# Patient Record
Sex: Male | Born: 1985 | Hispanic: Yes | Marital: Married | Smoking: Never smoker
Health system: Southern US, Community
[De-identification: ages and names within clinical notes are randomized; demographics above are authoritative.]

## PROBLEM LIST (undated history)

## (undated) DIAGNOSIS — I319 Disease of pericardium, unspecified: Secondary | ICD-10-CM

## (undated) DIAGNOSIS — Z87440 Personal history of urinary (tract) infections: Secondary | ICD-10-CM

## (undated) DIAGNOSIS — Z114 Encounter for screening for human immunodeficiency virus [HIV]: Secondary | ICD-10-CM

## (undated) HISTORY — DX: Personal history of urinary (tract) infections: Z87.440

## (undated) HISTORY — DX: Encounter for screening for human immunodeficiency virus (HIV): Z11.4

## (undated) HISTORY — PX: TYMPANOSTOMY TUBE PLACEMENT: SHX32

---
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
## 2012-09-14 ENCOUNTER — Emergency Department (HOSPITAL_COMMUNITY)
Admission: EM | Admit: 2012-09-14 | Discharge: 2012-09-14 | Disposition: A | Discharge status: Home / Self Care | Payer: Self-pay | Attending: Emergency Medicine | Admitting: Emergency Medicine

## 2012-09-14 ENCOUNTER — Emergency Department (HOSPITAL_COMMUNITY): Payer: Self-pay

## 2012-09-14 ENCOUNTER — Encounter (HOSPITAL_COMMUNITY): Payer: Self-pay

## 2012-09-14 DIAGNOSIS — Z880 Allergy status to penicillin: Secondary | ICD-10-CM | POA: Insufficient documentation

## 2012-09-14 DIAGNOSIS — Y939 Activity, unspecified: Secondary | ICD-10-CM | POA: Insufficient documentation

## 2012-09-14 DIAGNOSIS — W108XXA Fall (on) (from) other stairs and steps, initial encounter: Secondary | ICD-10-CM | POA: Insufficient documentation

## 2012-09-14 DIAGNOSIS — S93409A Sprain of unspecified ligament of unspecified ankle, initial encounter: Secondary | ICD-10-CM | POA: Insufficient documentation

## 2012-09-14 DIAGNOSIS — Y929 Unspecified place or not applicable: Secondary | ICD-10-CM | POA: Insufficient documentation

## 2012-09-14 DIAGNOSIS — S93402A Sprain of unspecified ligament of left ankle, initial encounter: Secondary | ICD-10-CM

## 2012-09-14 MED ORDER — HYDROCODONE-ACETAMINOPHEN 5-325 MG PO TABS: 1.0000 | ORAL_TABLET | Freq: Four times a day (QID) | ORAL | Status: DC | PRN

## 2012-09-14 NOTE — ED Notes (Signed)
Pt reports Left ankle pain and swelling due to falling down 5-6 steps last night.

## 2012-09-14 NOTE — ED Provider Notes (Signed)
CSN: [id number]     Arrival date & time 09/14/12  2000 History  This chart was scribed for non-physician practitioner, Jimmye Norman, NP, working with Enid Skeens, MD by Shari Heritage, ED Scribe. This patient was seen in room TR09C/TR09C and the patient's care was started at 2057.   First MD Initiated Contact with Patient 09/14/12 2057     Chief Complaint  Patient presents with  . Ankle Injury    Patient is a 27 y.o. male presenting with ankle pain. The history is provided by the patient. No language interpreter was used.  Ankle Pain Location:  Ankle Time since incident:  1 day Injury: yes   Mechanism of injury: fall   Mechanism of injury comment:  Inversion Fall:    Fall occurred:  Down stairs   Entrapped after fall: no   Ankle location:  L ankle Pain details:    Quality:  Burning   Radiates to:  Does not radiate   Severity:  Moderate   Duration:  1 day   Timing:  Constant Chronicity:  New Dislocation: no   Foreign body present:  No foreign bodies Relieved by:  Elevation and ice Associated symptoms: swelling and tingling (toes)   Associated symptoms: no back pain     HPI Comments: Ryan Harper is a 27 y.o. male who presents to the Emergency Department complaining of moderate, constant, non-radiating left ankle pain with associated swelling onset last night. Patient states that his foot inverted, he lost balance and fell down 5-6 steps. Patient has applied cold water and ice to the area. He has also elevated his left limb providing him some relief from pain. He states that when the ankle is not elevated, he has a burning sensation in his ankle. He also reports some tingling in his left toes. Patient denies any other injuries or symptoms at this time. He reports no pertinent past medical history. He does not smoke.  History reviewed. No pertinent past medical history. Past Surgical History  Procedure Laterality Date  . Tympanostomy tube placement     History  reviewed. No pertinent family history. History  Substance Use Topics  . Smoking status: Never Smoker   . Smokeless tobacco: Not on file  . Alcohol Use: Yes     Comment: social    Review of Systems  Musculoskeletal: Negative for back pain.  Neurological: Negative for weakness.  All other systems reviewed and are negative.    Allergies  Penicillins  Home Medications  No current outpatient prescriptions on file.  Triage Vitals: BP 153/83  Pulse 88  Temp(Src) 98.2 F (36.8 C) (Oral)  Resp 20  SpO2 95%   Physical Exam  Nursing note and vitals reviewed. Constitutional: He is oriented to person, place, and time. He appears well-developed and well-nourished. No distress.  HENT:  Head: Normocephalic and atraumatic.  Eyes: EOM are normal.  Neck: Neck supple. No tracheal deviation present.  Cardiovascular: Normal rate.   Pulmonary/Chest: Effort normal. No respiratory distress.  Musculoskeletal: Normal range of motion.       Right ankle: He exhibits swelling.       Right foot: He exhibits swelling.  Swelling to both sides of left ankle with discoloration. Swelling extends from the ankle to the top of the foot. Distal PMS is intact.  Neurological: He is alert and oriented to person, place, and time.  Skin: Skin is warm and dry.  Psychiatric: He has a normal mood and affect. His behavior is normal.  ED Course   Procedures (including critical care time) DIAGNOSTIC STUDIES: Oxygen Saturation is 95% on room air, adequate by my interpretation.    COORDINATION OF CARE: 9:09 PM- Patient informed of current plan for treatment and evaluation and agrees with plan at this time.    Dg Ankle Complete Left  09/14/2012   *RADIOLOGY REPORT*  Clinical Data: Twisting injury.  Ankle pain and swelling.  LEFT ANKLE COMPLETE - 3+ VIEW  Comparison: None.  Findings: The mineralization and alignment are normal.  There is no evidence of acute fracture or dislocation.  The joint spaces are  maintained.  There is diffuse soft tissue swelling surrounding the ankle with evidence of a small joint effusion.  IMPRESSION: No acute osseous findings.   Original Report Authenticated By: Carey Bullocks, M.D.     1. Severe ankle sprain, left, initial encounter     MDM   No fracture noted on xray.  Splint requested. Patient already has access to crutches.  Ortho follow-up.      I personally performed the services described in this documentation, which was scribed in my presence. The recorded information has been reviewed and is accurate.    Jimmye Norman, NP 09/14/12 [phone]

## 2012-09-16 NOTE — ED Provider Notes (Signed)
Medical screening examination/treatment/procedure(s) were performed by non-physician practitioner and as supervising physician I was immediately available for consultation/collaboration.   Enid Skeens, MD 09/16/12 0100

## 2017-12-18 ENCOUNTER — Ambulatory Visit: Payer: Self-pay | Admitting: Family Medicine

## 2017-12-18 ENCOUNTER — Encounter: Payer: Self-pay | Admitting: Family Medicine

## 2017-12-18 ENCOUNTER — Ambulatory Visit (INDEPENDENT_AMBULATORY_CARE_PROVIDER_SITE_OTHER): Payer: Managed Care, Other (non HMO) | Admitting: Family Medicine

## 2017-12-18 VITALS — BP 110/82 | HR 83 | Temp 97.8°F | Ht 72.0 in | Wt 246.0 lb

## 2017-12-18 DIAGNOSIS — H539 Unspecified visual disturbance: Secondary | ICD-10-CM | POA: Diagnosis not present

## 2017-12-18 DIAGNOSIS — Z1379 Encounter for other screening for genetic and chromosomal anomalies: Secondary | ICD-10-CM

## 2017-12-18 DIAGNOSIS — Z0000 Encounter for general adult medical examination without abnormal findings: Secondary | ICD-10-CM

## 2017-12-18 DIAGNOSIS — Z23 Encounter for immunization: Secondary | ICD-10-CM

## 2017-12-18 LAB — COMPREHENSIVE METABOLIC PANEL
ALBUMIN: 4.7 g/dL (ref 3.5–5.2)
ALT: 25 U/L (ref 0–53)
AST: 17 U/L (ref 0–37)
Alkaline Phosphatase: 86 U/L (ref 39–117)
BUN: 15 mg/dL (ref 6–23)
CHLORIDE: 103 meq/L (ref 96–112)
CO2: 30 meq/L (ref 19–32)
CREATININE: 0.88 mg/dL (ref 0.40–1.50)
Calcium: 9.7 mg/dL (ref 8.4–10.5)
GFR: 106.52 mL/min (ref >60.00)
GLUCOSE: 94 mg/dL (ref 70–99)
Potassium: 4 mEq/L (ref 3.5–5.1)
Sodium: 140 mEq/L (ref 135–145)
TOTAL PROTEIN: 7.3 g/dL (ref 6.0–8.3)
Total Bilirubin: 0.6 mg/dL (ref 0.2–1.2)

## 2017-12-18 LAB — CBC WITH DIFFERENTIAL/PLATELET
BASOS PCT: 0.6 % (ref 0.0–3.0)
Basophils Absolute: 0 10*3/uL (ref 0.0–0.1)
EOS ABS: 0.1 10*3/uL (ref 0.0–0.7)
Eosinophils Relative: 2 % (ref 0.0–5.0)
HCT: 45.9 % (ref 39.0–52.0)
Hemoglobin: 15.7 g/dL (ref 13.0–17.0)
Lymphocytes Relative: 50.9 % — ABNORMAL HIGH (ref 12.0–46.0)
Lymphs Abs: 3.3 10*3/uL (ref 0.7–4.0)
MCHC: 34.2 g/dL (ref 30.0–36.0)
MCV: 90.5 fl (ref 78.0–100.0)
MONO ABS: 0.5 10*3/uL (ref 0.1–1.0)
Monocytes Relative: 8.1 % (ref 3.0–12.0)
NEUTROS ABS: 2.5 10*3/uL (ref 1.4–7.7)
Neutrophils Relative %: 38.4 % — ABNORMAL LOW (ref 43.0–77.0)
Platelets: 313 10*3/uL (ref 150.0–400.0)
RBC: 5.07 Mil/uL (ref 4.22–5.81)
RDW: 13.3 % (ref 11.5–15.5)
WBC: 6.5 10*3/uL (ref 4.0–10.5)

## 2017-12-18 LAB — LIPID PANEL
CHOLESTEROL: 233 mg/dL — AB (ref 0–200)
HDL: 48.6 mg/dL (ref >39.00)
LDL Cholesterol: 147 mg/dL — ABNORMAL HIGH (ref 0–99)
NonHDL: 184.75
Total CHOL/HDL Ratio: 5
Triglycerides: 191 mg/dL — ABNORMAL HIGH (ref 0.0–149.0)
VLDL: 38.2 mg/dL (ref 0.0–40.0)

## 2017-12-18 LAB — TSH: TSH: 2.42 u[IU]/mL (ref 0.35–4.50)

## 2017-12-18 NOTE — Progress Notes (Signed)
Patient: Ryan Harper MRN: [id number] DOB: 01/19/1986 PCP: Orland Mustard, MD     Subjective:  Chief Complaint  Patient presents with  . Establish Care    HPI: The patient is a 32 y.o. male who presents today for annual exam. He denies any changes to past medical history. There have been no recent hospitalizations. They are following a well balanced diet and exercise plan. He does not do any exercise outside of work. Works at [employer].  Weight has been stable. No complaints today.   He is requesting a hgb electrophoresis to be done per OB due his wife's "condition."   There is no immunization history for the selected administration types on file for this patient.  Tdap: 2010 Flu: declines  Gc/c: declines hiv testing: done   Review of Systems  Constitutional: Negative for chills, fatigue and fever.  HENT: Negative for dental problem, ear pain, hearing loss and trouble swallowing.   Eyes: Negative for visual disturbance.  Respiratory: Negative for cough, chest tightness and shortness of breath.   Cardiovascular: Negative for chest pain, palpitations and leg swelling.  Gastrointestinal: Negative for abdominal pain, blood in stool, diarrhea and nausea.  Endocrine: Negative for cold intolerance, polydipsia, polyphagia and polyuria.  Genitourinary: Negative for dysuria and hematuria.  Musculoskeletal: Negative for arthralgias.  Skin: Negative for rash.  Neurological: Negative for dizziness and headaches.  Psychiatric/Behavioral: Negative for dysphoric mood and sleep disturbance. The patient is not nervous/anxious.     Allergies Patient is allergic to penicillins.  Past Medical History Patient  has no past medical history on file.  Surgical History Patient  has a past surgical history that includes Tympanostomy tube placement and Tympanostomy tube placement (1992).  Family History Pateint's family history is not on file.  Social History Patient   reports that he has never smoked. He has never used smokeless tobacco. He reports that he drinks alcohol. He reports that he does not use drugs.    Objective: Vitals:   12/18/17 0856  BP: 110/82  Pulse: 83  Temp: 97.8 F (36.6 C)  TempSrc: Oral  SpO2: 97%  Weight: 246 lb (111.6 kg)  Height: 6' (1.829 m)    Body mass index is 33.36 kg/m.  Physical Exam  Constitutional: He is oriented to person, place, and time. He appears well-developed and well-nourished.  HENT:  Right Ear: External ear normal.  Left Ear: External ear normal.  Mouth/Throat: Oropharynx is clear and moist.  Eyes: Pupils are equal, round, and reactive to light. Conjunctivae and EOM are normal.  Neck: Normal range of motion. Neck supple. No thyromegaly present.  Cardiovascular: Normal rate, regular rhythm, normal heart sounds and intact distal pulses.  No murmur heard. Pulmonary/Chest: Effort normal and breath sounds normal.  Abdominal: Soft. Bowel sounds are normal. He exhibits no distension. There is no tenderness.  Lymphadenopathy:    He has no cervical adenopathy.  Neurological: He is alert and oriented to person, place, and time. He displays normal reflexes. No cranial nerve deficit. Coordination normal.  Skin: Skin is warm and dry. No rash noted.  Psychiatric: He has a normal mood and affect. His behavior is normal.  Vitals reviewed.  Depression screen PHQ 2/9 12/18/2017  Decreased Interest 0  Down, Depressed, Hopeless 0  PHQ - 2 Score 0       Assessment/plan: 1. Annual physical exam Giving tdap since has newborn on way in 3 months. Declines flu, gc/c testing/has had HIV done. Really encouraged him to start a regular  exercise program to help with weight and overall cardiac health. F/u in one year.  Patient counseling [x]    Nutrition: Stressed importance of moderation in sodium/caffeine intake, saturated fat and cholesterol, caloric balance, sufficient intake of fresh fruits, vegetables, fiber,  calcium, iron, and 1 mg of folate supplement per day (for females capable of pregnancy).  [x]    Stressed the importance of regular exercise.   [x]    Substance Abuse: Discussed cessation/primary prevention of tobacco, alcohol, or other drug use; driving or other dangerous activities under the influence; availability of treatment for abuse.   [x]    Injury prevention: Discussed safety belts, safety helmets, smoke detector, smoking near bedding or upholstery.   [x]    Sexuality: Discussed sexually transmitted diseases, partner selection, use of condoms, avoidance of unintended pregnancy  and contraceptive alternatives.  [x]    Dental health: Discussed importance of regular tooth brushing, flossing, and dental visits.  [x]    Health maintenance and immunizations reviewed. Please refer to Health maintenance section.   - Comprehensive metabolic panel - CBC with Differential/Platelet - TSH - Lipid panel  2. Genetic screening Will check this, discussed usually referring OB would check this.  - Hemoglobinopathy evaluation  3. Need for Tdap vaccination Newborn coming.. Needs this.  - Tdap vaccine greater than or equal to 7yo IM  4. Vision changes Interested in lasix, but needs eye exam with his "eye concerns." referral done.  - Ambulatory referral to Ophthalmology     Return in about 1 year (around 12/19/2018).     Orland Mustard, MD [facility]  12/18/2017

## 2017-12-21 LAB — HEMOGLOBINOPATHY EVALUATION
HGB C: 0 %
HGB S: 0 %
HGB VARIANT: 0 %
Hemoglobin A2 Quantitation: 2.4 % (ref 1.8–3.2)
Hemoglobin F Quantitation: 0 % (ref 0.0–2.0)
Hgb A: 97.6 % (ref 96.4–98.8)

## 2017-12-28 ENCOUNTER — Telehealth: Payer: Self-pay | Admitting: Family Medicine

## 2017-12-28 NOTE — Telephone Encounter (Signed)
Charted in result notes. 

## 2017-12-28 NOTE — Telephone Encounter (Signed)
Copied from CRM [id number]. Topic: Quick Communication - Lab Results (Clinic Use ONLY) >> Dec 22, 2017  4:28 PM Zellmer, Alessandra BevelsJennifer M, CMA wrote: Called patient to inform them of 11/11 lab results. When patient returns call, triage nurse may disclose results.

## 2018-01-05 ENCOUNTER — Telehealth: Payer: Self-pay

## 2018-01-05 NOTE — Telephone Encounter (Signed)
Reviewed lab results and instructions with pt. Verbalizes understanding.

## 2018-01-05 NOTE — Telephone Encounter (Signed)
Pt would like a call back to go over results 

## 2018-02-26 ENCOUNTER — Encounter: Payer: Self-pay | Admitting: Family Medicine

## 2018-02-26 ENCOUNTER — Ambulatory Visit (INDEPENDENT_AMBULATORY_CARE_PROVIDER_SITE_OTHER): Payer: Managed Care, Other (non HMO) | Admitting: Family Medicine

## 2018-02-26 ENCOUNTER — Ambulatory Visit (INDEPENDENT_AMBULATORY_CARE_PROVIDER_SITE_OTHER): Payer: Managed Care, Other (non HMO)

## 2018-02-26 VITALS — BP 110/72 | HR 135 | Temp 101.9°F | Ht 72.0 in | Wt 243.8 lb

## 2018-02-26 DIAGNOSIS — R0602 Shortness of breath: Secondary | ICD-10-CM | POA: Diagnosis not present

## 2018-02-26 DIAGNOSIS — R6889 Other general symptoms and signs: Secondary | ICD-10-CM

## 2018-02-26 LAB — POCT INFLUENZA A/B
Influenza A, POC: NEGATIVE
Influenza B, POC: NEGATIVE

## 2018-02-26 NOTE — Progress Notes (Signed)
Patient: Ryan Harper MRN: [id number] DOB: [birth year] PCP: Orland Mustard, MD     Subjective:  Chief Complaint  Patient presents with  . Influenza  . Shortness of Breath    HPI: The patient is a 33 y.o. male who presents today for flu like illness. Symptoms started yesterday when he woke up around 1pm. He works 3rd shift. He had a high fever yesterday, cough, congestion, achy all over. He has one sick contact at work. Unsure what he had. He has taken some over the counter medication, he thinks is mucinex. Nothing for his fever. He is drinking water. He has some shortness of breath, but no wheezing. He has had a few episodes where he felt short of breath and had a tight chest. He thought he was going to pass out. He felt like his heart was racing too, like he ran a race. He did have a high fever at that time. His cough is productive and he has a struggle he states getting air all the way in. He is feeling better today than yesterday.   Review of Systems  Constitutional: Positive for chills, fatigue and fever.  HENT: Positive for congestion, postnasal drip and sore throat.   Respiratory: Positive for cough, chest tightness and shortness of breath. Negative for wheezing.   Gastrointestinal: Positive for nausea. Negative for abdominal pain, diarrhea and vomiting.  Musculoskeletal: Positive for arthralgias and myalgias.  Skin: Negative for rash.  Neurological: Positive for headaches.    Allergies Patient is allergic to penicillins.  Past Medical History Patient  has a past medical history of History of recurrent UTIs and Screening for HIV (human immunodeficiency virus).  Surgical History Patient  has a past surgical history that includes Tympanostomy tube placement and Tympanostomy tube placement (1992).  Family History Pateint's Family history is unknown by patient.  Social History Patient  reports that he has never smoked. He has never used smokeless tobacco. He reports  current alcohol use. He reports that he does not use drugs.    Objective: Vitals:   02/26/18 1429  BP: 110/72  Pulse: (!) 135  Temp: (!) 101.9 F (38.8 C)  TempSrc: Oral  SpO2: 98%  Weight: 243 lb 12 oz (110.6 kg)  Height: 6' (1.829 m)    Body mass index is 33.06 kg/m.  Physical Exam Vitals signs reviewed.  Constitutional:      General: He is not in acute distress.    Appearance: He is obese. He is not toxic-appearing.  HENT:     Head:     Comments: Tm pearly with light reflex bilaterally  +cobblestoning on posterior pharynx TTP over sinuses Neck:     Musculoskeletal: Normal range of motion and neck supple.  Cardiovascular:     Rate and Rhythm: Regular rhythm. Tachycardia present.     Heart sounds: No murmur.  Pulmonary:     Effort: Pulmonary effort is normal. No tachypnea.     Breath sounds: Normal breath sounds. No decreased breath sounds, wheezing, rhonchi or rales.  Skin:    Capillary Refill: Capillary refill takes less than 2 seconds.  Neurological:     General: No focal deficit present.     Mental Status: He is alert and oriented to person, place, and time.    Flu: negative CXR: no acute processes.     Assessment/plan: 1. Flu-like symptoms symptoms consistent with flu. Declines tamiflu. Will continue with conservative therapy and discussed he needs to get medication for fever. His heart rate is fast  due to fever and he needs to make sure he is drinking. Discussed tylenol and motrin dosage and wrote down for him. Discussed otc medication he can use as well. His wife is pregnant and recommended they call OB to see if she can get tamiflu. If symptoms worsen, do not get better-ER or return here.  - POCT Influenza A/B  2. shortness of breath -CXR normal. He is better today. likely secondary to flu with no signs of pneumonia. If worsens or has chest pressure recommended he go to ER.    Return if symptoms worsen or fail to improve.     Orland Mustard,  MD [facility]  02/26/2018

## 2018-02-26 NOTE — Patient Instructions (Addendum)
For fever.Ryan KitchenMarland Harper  1) tylenol 1000mg  every 8 hours or 500mg  every 6 hours.   2) ibuprofen 800mg  up to three times a day.    Push fluids...  If you get increased shortness of breath or feel like pressure.Ryan Harper go to ER   Influenza, Adult Influenza is also called "the flu." It is an infection in the lungs, nose, and throat (respiratory tract). It is caused by a virus. The flu causes symptoms that are similar to symptoms of a cold. It also causes a high fever and body aches. The flu spreads easily from person to person (is contagious). Getting a flu shot (influenza vaccination) every year is the best way to prevent the flu. What are the causes? This condition is caused by the influenza virus. You can get the virus by:  Breathing in droplets that are in the air from the cough or sneeze of a person who has the virus.  Touching something that has the virus on it (is contaminated) and then touching your mouth, nose, or eyes. What increases the risk? Certain things may make you more likely to get the flu. These include:  Not washing your hands often.  Having close contact with many people during cold and flu season.  Touching your mouth, eyes, or nose without first washing your hands.  Not getting a flu shot every year. You may have a higher risk for the flu, along with serious problems such as a lung infection (pneumonia), if you:  Are older than 65.  Are pregnant.  Have a weakened disease-fighting system (immune system) because of a disease or taking certain medicines.  Have a long-term (chronic) illness, such as: ? Heart, kidney, or lung disease. ? Diabetes. ? Asthma.  Have a liver disorder.  Are very overweight (morbidly obese).  Have anemia. This is a condition that affects your red blood cells. What are the signs or symptoms? Symptoms usually begin suddenly and last 4-14 days. They may include:  Fever and chills.  Headaches, body aches, or muscle aches.  Sore  throat.  Cough.  Runny or stuffy (congested) nose.  Chest discomfort.  Not wanting to eat as much as normal (poor appetite).  Weakness or feeling tired (fatigue).  Dizziness.  Feeling sick to your stomach (nauseous) or throwing up (vomiting). How is this treated? If the flu is found early, you can be treated with medicine that can help reduce how bad the illness is and how long it lasts (antiviral medicine). This may be given by mouth (orally) or through an IV tube. Taking care of yourself at home can help your symptoms get better. Your doctor may suggest:  Taking over-the-counter medicines.  Drinking plenty of fluids. The flu often goes away on its own. If you have very bad symptoms or other problems, you may be treated in a hospital. Follow these instructions at home:     Activity  Rest as needed. Get plenty of sleep.  Stay home from work or school as told by your doctor. ? Do not leave home until you do not have a fever for 24 hours without taking medicine. ? Leave home only to visit your doctor. Eating and drinking  Take an ORS (oral rehydration solution). This is a drink that is sold at pharmacies and stores.  Drink enough fluid to keep your pee (urine) pale yellow.  Drink clear fluids in small amounts as you are able. Clear fluids include: ? Water. ? Ice chips. ? Fruit juice that has water added (  diluted fruit juice). ? Low-calorie sports drinks.  Eat bland, easy-to-digest foods in small amounts as you are able. These foods include: ? Bananas. ? Applesauce. ? Rice. ? Lean meats. ? Toast. ? Crackers.  Do not eat or drink: ? Fluids that have a lot of sugar or caffeine. ? Alcohol. ? Spicy or fatty foods. General instructions  Take over-the-counter and prescription medicines only as told by your doctor.  Use a cool mist humidifier to add moisture to the air in your home. This can make it easier for you to breathe.  Cover your mouth and nose when you  cough or sneeze.  Wash your hands with soap and water often, especially after you cough or sneeze. If you cannot use soap and water, use alcohol-based hand sanitizer.  Keep all follow-up visits as told by your doctor. This is important. How is this prevented?   Get a flu shot every year. You may get the flu shot in late summer, fall, or winter. Ask your doctor when you should get your flu shot.  Avoid contact with people who are sick during fall and winter (cold and flu season). Contact a doctor if:  You get new symptoms.  You have: ? Chest pain. ? Watery poop (diarrhea). ? A fever.  Your cough gets worse.  You start to have more mucus.  You feel sick to your stomach.  You throw up. Get help right away if you:  Have shortness of breath.  Have trouble breathing.  Have skin or nails that turn a bluish color.  Have very bad pain or stiffness in your neck.  Get a sudden headache.  Get sudden pain in your face or ear.  Cannot eat or drink without throwing up. Summary  Influenza ("the flu") is an infection in the lungs, nose, and throat. It is caused by a virus.  Take over-the-counter and prescription medicines only as told by your doctor.  Getting a flu shot every year is the best way to avoid getting the flu. This information is not intended to replace advice given to you by your health care provider. Make sure you discuss any questions you have with your health care provider. Document Released: 11/03/2007 Document Revised: 07/12/2017 Document Reviewed: 07/12/2017 Elsevier Interactive Patient Education  2019 [employer]

## 2018-02-28 ENCOUNTER — Telehealth: Payer: Self-pay | Admitting: Family Medicine

## 2018-02-28 NOTE — Telephone Encounter (Signed)
Pt given x-ray results per notes of Dr. Artis Flock on 02/26/18. Pt verbalized understanding. He says the chest pain that he was having is still continuing. He says Sunday was bad and he was lightheaded. He says it's coming on off and on. He asks for an appointment with Dr. Artis Flock, appointment scheduled for tomorrow, 03/01/18 at 1300. Advised to go to ED if chest pain persists as noted by last OV note, he verbalized understanding.

## 2018-03-01 ENCOUNTER — Encounter: Payer: Self-pay | Admitting: Family Medicine

## 2018-03-01 ENCOUNTER — Ambulatory Visit (INDEPENDENT_AMBULATORY_CARE_PROVIDER_SITE_OTHER): Payer: Managed Care, Other (non HMO) | Admitting: Family Medicine

## 2018-03-01 VITALS — BP 102/74 | HR 100 | Temp 98.1°F | Ht 72.0 in | Wt 243.6 lb

## 2018-03-01 DIAGNOSIS — R002 Palpitations: Secondary | ICD-10-CM | POA: Diagnosis not present

## 2018-03-01 DIAGNOSIS — I4892 Unspecified atrial flutter: Secondary | ICD-10-CM

## 2018-03-01 DIAGNOSIS — R0602 Shortness of breath: Secondary | ICD-10-CM

## 2018-03-01 DIAGNOSIS — I300 Acute nonspecific idiopathic pericarditis: Secondary | ICD-10-CM | POA: Diagnosis not present

## 2018-03-01 MED ORDER — COLCHICINE 0.6 MG PO TABS: 0.6000 mg | ORAL_TABLET | Freq: Two times a day (BID) | ORAL | 0 refills | Status: DC

## 2018-03-01 MED ORDER — PANTOPRAZOLE SODIUM 40 MG PO TBEC: 40.0000 mg | DELAYED_RELEASE_TABLET | Freq: Every day | ORAL | 0 refills | Status: DC

## 2018-03-01 MED ORDER — IBUPROFEN 800 MG PO TABS: ORAL_TABLET | ORAL | 0 refills | Status: DC

## 2018-03-01 NOTE — Progress Notes (Signed)
Patient: Ryan Harper MRN: [id number] DOB: July 25, 1985 PCP: Orland Mustard, MD     Subjective:  Chief Complaint  Patient presents with  . Shortness of Breath    HPI: The patient is a 33 y.o. male who presents today for shortness of breath and palpitations with exertion. I saw him on 1/20 for the flu and he had a negative CXR at that time. He states symptoms are only with exertion and he gets chest tightness/pain and shortness of breath. Symptoms usually resolve quickly and are relieved with squatting down or geting low. He only has if he strains, lifts something heavy. He would also feel his pulse throbbing in his head and a strange sensation on side/back of left side of his head that goes away after he stops exerting himself and shortness of breath resolves. Denies any vision changes, slurred speech, weakness, facial drooping, confusion during head sensation. He states the longest episode was ten minutes while raking leaves. He has had about 4-5 episodes in the past two days. He is feeling better as far as his other viral symptoms. He has not had any fevers since I saw him last time. He is no longer achy, just feels weak.   Denies travel, prolonged sitting, never smoked, no trauma. NO history of DVT or family history of clotting.   Review of Systems  Constitutional: Positive for fatigue. Negative for chills and fever.  HENT: Negative for congestion.   Respiratory: Positive for cough, chest tightness and shortness of breath. Negative for wheezing.   Cardiovascular: Positive for palpitations. Negative for chest pain and leg swelling.  Gastrointestinal: Negative for abdominal pain and nausea.  Musculoskeletal: Negative for arthralgias and myalgias.  Skin: Negative for rash.  Neurological: Positive for light-headedness and headaches. Negative for syncope, facial asymmetry, speech difficulty, weakness and numbness.  Psychiatric/Behavioral: Negative for confusion.   Risks for PE: no  smoking, no travel in car/plane, no trauma. Recent illness.   Allergies Patient is allergic to penicillins.  Past Medical History Patient  has a past medical history of History of recurrent UTIs and Screening for HIV (human immunodeficiency virus).  Surgical History Patient  has a past surgical history that includes Tympanostomy tube placement and Tympanostomy tube placement (1992).  Family History Pateint's Family history is unknown by patient.  Social History Patient  reports that he has never smoked. He has never used smokeless tobacco. He reports current alcohol use. He reports that he does not use drugs.    Objective: Vitals:   03/01/18 1425  BP: 102/74  Pulse: 100  Temp: 98.1 F (36.7 C)  TempSrc: Oral  SpO2: 99%  Weight: 243 lb 9.6 oz (110.5 kg)  Height: 6' (1.829 m)    Body mass index is 33.04 kg/m.  Physical Exam Vitals signs reviewed.  Constitutional:      General: He is not in acute distress.    Appearance: He is obese. He is not ill-appearing.  HENT:     Head: Normocephalic and atraumatic.  Neck:     Musculoskeletal: Normal range of motion and neck supple.     Vascular: No hepatojugular reflux.  Cardiovascular:     Rate and Rhythm: Normal rate and regular rhythm.     Heart sounds: No murmur. No friction rub.  Lymphadenopathy:     Cervical: No cervical adenopathy.  Neurological:     Mental Status: He is alert.       Ekg: atrial flutter, rate of 83  Wells score: zero   Assessment/plan: 1.  Atrial flutter, unspecified type (HCC) Possibly secondary to pericarditis. clinically stable at this time. Checking routine labs for palpitations. PE very unlikely as no risk factors and wells score of zero. CXR with no acute consolidation/infiltrate. Getting stat echo on him that is scheduled for tomorrow AM, starting treatment for pericarditis with NSAIDS/colchicine, no exercise and has f/u with cardiology. Extremely strict ER precautions given. If fever, any  worsening sob or change in symptoms I want him to go to ER immediately.  - EKG 12-Lead - EKG 12-Lead - ECHOCARDIOGRAM COMPLETE; Future  2. Shortness of breath Secondary to 1/3.  - ECHOCARDIOGRAM COMPLETE; Future  3. Acute idiopathic pericarditis Checking labs, starting treatment for acute pericarditis with NSAIDS/Colchicine and prophylactic protonix. No exercise/heavy exertion until sees cardiology. Echo for tomorrow AM. Strict ER precautions given per above. Wrote everything down for him as well.  - Ambulatory referral to Cardiology - CBC with Differential/Platelet - Comprehensive metabolic panel - C-reactive protein   Return if symptoms worsen or fail to improve.     Orland MustardAllison Natalea Sutliff, MD [facility]  03/01/2018

## 2018-03-01 NOTE — Patient Instructions (Addendum)
-you will start ibuprofen 800mg  three times a day for 10 days -colchicine twice a day for THREE MONTHS -protonix is a pill that you will take every AM until done with the colchicine. It just protects your stomach from the above drugs. Common to see ulcers.   Will get echo of your heart and refer to cardiology.  If you experience fever, shortness of breath, fast heart rate or just don't feel good I want you to go to ER.   No exercise until see cardiology   Pericarditis  Pericarditis is inflammation of the pericardium. The pericardium is a thin, double-layered, fluid-filled sac that surrounds your heart. The pericardium protects and holds your heart in your chest cavity. Inflammation of your pericardium can cause rubbing (friction) between the two layers when your heart beats. Fluid may also build up between the layers of the sac (pericardial effusion). There are three types of this condition:  Acute pericarditis. Inflammation develops suddenly and causes pericardial effusion.  Chronic pericarditis. Inflammation may develop slowly, or it may continue after acute pericarditis and last longer than 6 months.  Constrictive pericarditis (rare). The layers of the pericardium stiffen and develop scar tissue. The scar tissue thickens and sticks together. This makes it difficult for the heart to work in a normal way. In most cases, pericarditis is acute and not serious. Chronic pericarditis and constrictive pericarditis are more serious and may require treatment. What are the causes? In most cases, the cause of this condition is not known. If a cause is found, it may be:  An infection from a virus.  A heart attack (myocardial infarction).  Problems after open-heart surgery.  Chest injury.  Autoimmune disorder. The body's defense system (immune system) attacks healthy tissues. Examples include lupus or rheumatoid arthritis.  Kidney failure.  Underactive thyroid gland  (hypothyroidism).  Cancer that has spread (metastasized) to the pericardium from another part of the body.  Treatment using high-energy X-rays (radiation).  Certain medicines, including some seizure medicines, blood thinners, heart medicines, and antibiotics.  Infection from a fungus or bacteria (rare). What increases the risk? The following factors may make you more likely to develop this condition:  Being male.  Being 58-46 years old.  Having had pericarditis before.  Having had a recent upper respiratory tract infection. What are the signs or symptoms? The main symptom of this condition is chest pain. The chest pain may:  Be in the center or the left side of your chest.  Not go away with rest.  Last for many hours or days.  Worsen when you lie down and get better when you sit up and lean forward.  Worsen when you swallow.  Move to your back, neck, or shoulder. Other symptoms may include:  A chronic, dry cough.  Heart palpitations. These may feel like rapid, fluttering, or pounding heartbeats.  Dizziness or fainting.  Tiredness or fatigue.  Fever.  Rapid breathing.  Shortness of breath when lying down. How is this diagnosed? This condition is diagnosed based on medical history, physical exam, and diagnostic tests. During your physical exam, your health care provider will listen for friction while your heart beats (pericardial rub). You may also have tests, including:  Blood tests to look for signs of infection and inflammation.  Electrocardiogram (ECG). This test measures the electrical activity in your heart.  Echocardiogram. This uses sound waves to make images of your heart.  CT scan.  MRI.  Culture of pericardial fluid.  Removing a tissue sample of the  pericardium to look at under a microscope (biopsy). If tests show that you may have constrictive pericarditis, a small, thin tube may be inserted into your heart to confirm the diagnosis (cardiac  catheterization). How is this treated? Treatment for this condition depends on the cause and type of pericarditis that you have. In most cases, acute pericarditis will clear up on its own. Treatment may include:  Limiting physical activity.  Medicines, such as: ? NSAIDs to relieve pain and inflammation. ? Steroids to reduce inflammation. ? Colchicine to relieve pain and inflammation.  A procedure to remove fluid using a needle (pericardiocentesis) if fluid buildup puts pressure on the heart.  Surgery to remove part of the pericardium if constrictive pericarditis develops. If another condition is causing your pericarditis, you may also need treatment for that condition. Follow these instructions at home:  Limit your activity as told by your health care provider until your symptoms improve. This usually includes: ? Resting or sitting for most of the day. ? No long walks. ? No exercise. ? No sports.  Athletes may need to limit competition for several months.  Do not use any products that contain nicotine or tobacco, such as cigarettes and e-cigarettes. If you need help quitting, ask your health care provider.  Eat a heart-healthy diet. Ask your dietitian what foods are healthy for your heart.  Take over-the-counter and prescription medicines only as told by your health care provider. ? If you were prescribed an antibiotic medicine, take it as told by your health care provider. Do not stop taking the antibiotic even if you start to feel better.  Keep all follow-up visits as told by your health care provider. This is important. Contact a health care provider if:  You continue to have symptoms of pericarditis.  You develop new symptoms of pericarditis.  Your symptoms get worse.  You have a fever. Get help right away if:  You have worsening chest pain.  You have difficulty breathing.  You pass out. These symptoms may represent a serious problem that is an emergency. Do not  wait to see if the symptoms will go away. Get medical help right away. Call your local emergency services (911 in the [place] Do not drive yourself to the hospital. Summary  Pericarditis is inflammation of the pericardium.  The pericardium is a thin, double-layered, fluid-filled sac that surrounds your heart.  The main symptom of this condition is chest pain.  Treatment for this condition depends on the cause and type of pericarditis that you have. This information is not intended to replace advice given to you by your health care provider. Make sure you discuss any questions you have with your health care provider. Document Released: 07/20/2000 Document Revised: 03/02/2017 Document Reviewed: 03/02/2017 Elsevier Interactive Patient Education  2019 [employer].

## 2018-03-02 ENCOUNTER — Other Ambulatory Visit (INDEPENDENT_AMBULATORY_CARE_PROVIDER_SITE_OTHER): Payer: Managed Care, Other (non HMO)

## 2018-03-02 ENCOUNTER — Telehealth: Payer: Self-pay

## 2018-03-02 ENCOUNTER — Ambulatory Visit (HOSPITAL_COMMUNITY): Payer: Managed Care, Other (non HMO) | Attending: Cardiovascular Disease

## 2018-03-02 ENCOUNTER — Other Ambulatory Visit: Payer: Self-pay | Admitting: Family Medicine

## 2018-03-02 ENCOUNTER — Other Ambulatory Visit: Payer: Self-pay

## 2018-03-02 DIAGNOSIS — I4892 Unspecified atrial flutter: Secondary | ICD-10-CM

## 2018-03-02 DIAGNOSIS — R0602 Shortness of breath: Secondary | ICD-10-CM | POA: Insufficient documentation

## 2018-03-02 LAB — CBC WITH DIFFERENTIAL/PLATELET
BASOS PCT: 0.9 % (ref 0.0–3.0)
Basophils Absolute: 0 10*3/uL (ref 0.0–0.1)
EOS ABS: 0 10*3/uL (ref 0.0–0.7)
Eosinophils Relative: 0.5 % (ref 0.0–5.0)
HEMATOCRIT: 47.9 % (ref 39.0–52.0)
HEMOGLOBIN: 16.3 g/dL (ref 13.0–17.0)
LYMPHS PCT: 50.7 % — AB (ref 12.0–46.0)
Lymphs Abs: 2.4 10*3/uL (ref 0.7–4.0)
MCHC: 34.1 g/dL (ref 30.0–36.0)
MCV: 91.4 fl (ref 78.0–100.0)
Monocytes Absolute: 0.6 10*3/uL (ref 0.1–1.0)
Monocytes Relative: 12.6 % — ABNORMAL HIGH (ref 3.0–12.0)
Neutro Abs: 1.7 10*3/uL (ref 1.4–7.7)
Neutrophils Relative %: 35.3 % — ABNORMAL LOW (ref 43.0–77.0)
Platelets: 230 10*3/uL (ref 150.0–400.0)
RBC: 5.24 Mil/uL (ref 4.22–5.81)
RDW: 13.3 % (ref 11.5–15.5)
WBC: 4.7 10*3/uL (ref 4.0–10.5)

## 2018-03-02 LAB — COMPREHENSIVE METABOLIC PANEL
ALBUMIN: 4.5 g/dL (ref 3.5–5.2)
ALK PHOS: 67 U/L (ref 39–117)
ALT: 22 U/L (ref 0–53)
AST: 17 U/L (ref 0–37)
BILIRUBIN TOTAL: 0.4 mg/dL (ref 0.2–1.2)
BUN: 8 mg/dL (ref 6–23)
CALCIUM: 9.3 mg/dL (ref 8.4–10.5)
CO2: 32 mEq/L (ref 19–32)
Chloride: 101 mEq/L (ref 96–112)
Creatinine, Ser: 0.88 mg/dL (ref 0.40–1.50)
GFR: 100.09 mL/min (ref >60.00)
GLUCOSE: 93 mg/dL (ref 70–99)
Potassium: 4 mEq/L (ref 3.5–5.1)
Sodium: 140 mEq/L (ref 135–145)
TOTAL PROTEIN: 7.5 g/dL (ref 6.0–8.3)

## 2018-03-02 LAB — TSH: TSH: 1.19 u[IU]/mL (ref 0.35–4.50)

## 2018-03-02 LAB — C-REACTIVE PROTEIN: CRP: 0.4 mg/dL — AB (ref 0.5–20.0)

## 2018-03-02 NOTE — Telephone Encounter (Signed)
Patient also informed that his echocardiogram result was normal, showed that heart is pumping normal, atria are slightly dilated and can discuss on 2/18 at his appt w/cardiology.  He verbalized understanding.

## 2018-03-07 ENCOUNTER — Telehealth: Payer: Self-pay

## 2018-03-07 ENCOUNTER — Other Ambulatory Visit: Payer: Self-pay

## 2018-03-07 NOTE — Telephone Encounter (Signed)
Pt stopped in office to pick up new work excuse.  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  New letter drafted and given to pt.

## 2018-03-27 ENCOUNTER — Encounter

## 2018-03-27 ENCOUNTER — Ambulatory Visit: Payer: Managed Care, Other (non HMO) | Admitting: Cardiology

## 2018-04-25 ENCOUNTER — Encounter (HOSPITAL_COMMUNITY): Payer: Self-pay

## 2018-04-25 ENCOUNTER — Emergency Department (HOSPITAL_COMMUNITY): Payer: Managed Care, Other (non HMO)

## 2018-04-25 ENCOUNTER — Observation Stay (HOSPITAL_COMMUNITY)
Admission: EM | Admit: 2018-04-25 | Discharge: 2018-04-26 | Disposition: A | Discharge status: Home / Self Care | Payer: Managed Care, Other (non HMO) | Attending: Cardiovascular Disease | Admitting: Cardiovascular Disease

## 2018-04-25 DIAGNOSIS — I4892 Unspecified atrial flutter: Secondary | ICD-10-CM | POA: Diagnosis not present

## 2018-04-25 DIAGNOSIS — Z79899 Other long term (current) drug therapy: Secondary | ICD-10-CM | POA: Diagnosis not present

## 2018-04-25 DIAGNOSIS — I499 Cardiac arrhythmia, unspecified: Secondary | ICD-10-CM | POA: Diagnosis present

## 2018-04-25 DIAGNOSIS — Z880 Allergy status to penicillin: Secondary | ICD-10-CM | POA: Diagnosis not present

## 2018-04-25 DIAGNOSIS — I4891 Unspecified atrial fibrillation: Secondary | ICD-10-CM | POA: Diagnosis present

## 2018-04-25 DIAGNOSIS — Z791 Long term (current) use of non-steroidal anti-inflammatories (NSAID): Secondary | ICD-10-CM | POA: Insufficient documentation

## 2018-04-25 DIAGNOSIS — R000 Tachycardia, unspecified: Secondary | ICD-10-CM

## 2018-04-25 DIAGNOSIS — I472 Ventricular tachycardia: Secondary | ICD-10-CM

## 2018-04-25 HISTORY — DX: Disease of pericardium, unspecified: I31.9

## 2018-04-25 LAB — POCT I-STAT EG7
Bicarbonate: 27.3 mmol/L (ref 20.0–28.0)
Calcium, Ion: 1.15 mmol/L (ref 1.15–1.40)
HCT: 47 % (ref 39.0–52.0)
Hemoglobin: 16 g/dL (ref 13.0–17.0)
O2 Saturation: 42 %
Potassium: 4 mmol/L (ref 3.5–5.1)
Sodium: 140 mmol/L (ref 135–145)
TCO2: 29 mmol/L (ref 22–32)
pCO2, Ven: 51.7 mmHg (ref 44.0–60.0)
pH, Ven: 7.33 (ref 7.250–7.430)
pO2, Ven: 26 mmHg — CL (ref 32.0–45.0)

## 2018-04-25 LAB — CBC WITH DIFFERENTIAL/PLATELET
ABS IMMATURE GRANULOCYTES: 0.04 10*3/uL (ref 0.00–0.07)
Basophils Absolute: 0.1 10*3/uL (ref 0.0–0.1)
Basophils Relative: 0 %
EOS ABS: 0.1 10*3/uL (ref 0.0–0.5)
Eosinophils Relative: 0 %
HEMATOCRIT: 46.4 % (ref 39.0–52.0)
Hemoglobin: 15.9 g/dL (ref 13.0–17.0)
IMMATURE GRANULOCYTES: 0 %
LYMPHS ABS: 4.8 10*3/uL — AB (ref 0.7–4.0)
Lymphocytes Relative: 43 %
MCH: 30.7 pg (ref 26.0–34.0)
MCHC: 34.3 g/dL (ref 30.0–36.0)
MCV: 89.6 fL (ref 80.0–100.0)
MONO ABS: 0.9 10*3/uL (ref 0.1–1.0)
MONOS PCT: 8 %
NEUTROS ABS: 5.4 10*3/uL (ref 1.7–7.7)
NEUTROS PCT: 49 %
Platelets: 403 10*3/uL — ABNORMAL HIGH (ref 150–400)
RBC: 5.18 MIL/uL (ref 4.22–5.81)
RDW: 12.3 % (ref 11.5–15.5)
WBC: 11.2 10*3/uL — ABNORMAL HIGH (ref 4.0–10.5)
nRBC: 0 % (ref 0.0–0.2)

## 2018-04-25 LAB — COMPREHENSIVE METABOLIC PANEL
ALT: 53 U/L — AB (ref 0–44)
AST: 29 U/L (ref 15–41)
Albumin: 4.8 g/dL (ref 3.5–5.0)
Alkaline Phosphatase: 99 U/L (ref 38–126)
Anion gap: 12 (ref 5–15)
BILIRUBIN TOTAL: 0.6 mg/dL (ref 0.3–1.2)
BUN: 14 mg/dL (ref 6–20)
CALCIUM: 9.4 mg/dL (ref 8.9–10.3)
CO2: 26 mmol/L (ref 22–32)
CREATININE: 1.41 mg/dL — AB (ref 0.61–1.24)
Chloride: 101 mmol/L (ref 98–111)
GFR calc Af Amer: >60 mL/min (ref >60)
Glucose, Bld: 159 mg/dL — ABNORMAL HIGH (ref 70–99)
Potassium: 3.8 mmol/L (ref 3.5–5.1)
Sodium: 139 mmol/L (ref 135–145)
TOTAL PROTEIN: 8.6 g/dL — AB (ref 6.5–8.1)

## 2018-04-25 LAB — I-STAT TROPONIN, ED: TROPONIN I, POC: 0.01 ng/mL (ref 0.00–0.08)

## 2018-04-25 LAB — C-REACTIVE PROTEIN: CRP: 1 mg/dL — AB (ref <1.0)

## 2018-04-25 LAB — I-STAT CREATININE, ED: Creatinine, Ser: 1.5 mg/dL — ABNORMAL HIGH (ref 0.61–1.24)

## 2018-04-25 MED ORDER — AMIODARONE HCL IN DEXTROSE 360-4.14 MG/200ML-% IV SOLN
60.0000 mg/h | INTRAVENOUS | Status: DC
  Filled 2018-04-25: qty 200

## 2018-04-25 MED ORDER — AMIODARONE LOAD VIA INFUSION
150.0000 mg | Freq: Once | INTRAVENOUS | Status: DC
  Filled 2018-04-25: qty 83.34

## 2018-04-25 MED ORDER — AMIODARONE HCL IN DEXTROSE 360-4.14 MG/200ML-% IV SOLN
30.0000 mg/h | INTRAVENOUS | Status: DC
  Administered 2018-04-25: 30 mg/h via INTRAVENOUS
  Filled 2018-04-25: qty 200

## 2018-04-25 MED ORDER — AMIODARONE IV BOLUS ONLY 150 MG/100ML
INTRAVENOUS | Status: AC
  Administered 2018-04-25: 300 mg
  Filled 2018-04-25: qty 200

## 2018-04-25 NOTE — ED Notes (Signed)
ED TO INPATIENT HANDOFF REPORT  ED Nurse Name and Phone #: Maralyn Sago, RN   S Name/Age/Gender [name] 33 y.o. male Room/Bed: RESA/RESA  Code Status   Code Status: Not on file  Home/SNF/Other Home Patient oriented to: self Is this baseline? Yes   Triage Complete: Triage complete  Chief Complaint Tachycardia  Triage Note Pt complains of being short of breath and a fast heartrate, he states that he had the flu and pericarditis one month ago Pt states that he feels very dizzy and nauseated This started about 30 minutes ago   Allergies Allergies  Allergen Reactions  . Penicillins Other (See Comments)    Did it involve swelling of the face/tongue/throat, SOB, or low BP? Unknown Did it involve sudden or severe rash/hives, skin peeling, or any reaction on the inside of your mouth or nose? Unknown Did you need to seek medical attention at a hospital or doctor's office? Unknown When did it last happen?always told not to take If all above answers are "NO", may proceed with cephalosporin use.     Level of Care/Admitting Diagnosis ED Disposition    ED Disposition Condition Comment   Admit  Hospital Area: [facility] [[id number]]  Level of Care: Telemetry Cardiac [103]  Diagnosis: Rapid atrial fibrillation [facility]) [[id number]]  Admitting Physician: Kristine Royal  Attending Physician: Prentice Docker A [[id number]]  Estimated length of stay: past midnight tomorrow  Certification:: I certify this patient will need inpatient services for at least 2 midnights  PT Class (Do Not Modify): Inpatient [101]  PT Acc Code (Do Not Modify): Private [1]       B Medical/Surgery History Past Medical History:  Diagnosis Date  . History of recurrent UTIs   . Pericarditis   . Screening for HIV (human immunodeficiency virus)    Past Surgical History:  Procedure Laterality Date  . TYMPANOSTOMY TUBE PLACEMENT    . TYMPANOSTOMY TUBE PLACEMENT  1992      A IV Location/Drains/Wounds Patient Lines/Drains/Airways Status   Active Line/Drains/Airways    Name:   Placement date:   Placement time:   Site:   Days:   Peripheral IV 04/25/18 Left Antecubital   04/25/18    2238    Antecubital   less than 1          Intake/Output Last 24 hours No intake or output data in the 24 hours ending 04/25/18 2353  Labs/Imaging Results for orders placed or performed during the hospital encounter of 04/25/18 (from the past 48 hour(s))  CBC with Differential/Platelet     Status: Abnormal   Collection Time: 04/25/18 10:43 PM  Result Value Ref Range   WBC 11.2 (H) 4.0 - 10.5 K/uL   RBC 5.18 4.22 - 5.81 MIL/uL   Hemoglobin 15.9 13.0 - 17.0 g/dL   HCT 72.5 36.6 - 44.0 %   MCV 89.6 80.0 - 100.0 fL   MCH 30.7 26.0 - 34.0 pg   MCHC 34.3 30.0 - 36.0 g/dL   RDW 34.7 42.5 - 95.6 %   Platelets 403 (H) 150 - 400 K/uL   nRBC 0.0 0.0 - 0.2 %   Neutrophils Relative % 49 %   Neutro Abs 5.4 1.7 - 7.7 K/uL   Lymphocytes Relative 43 %   Lymphs Abs 4.8 (H) 0.7 - 4.0 K/uL   Monocytes Relative 8 %   Monocytes Absolute 0.9 0.1 - 1.0 K/uL   Eosinophils Relative 0 %   Eosinophils Absolute 0.1 0.0 - 0.5 K/uL  Basophils Relative 0 %   Basophils Absolute 0.1 0.0 - 0.1 K/uL   Immature Granulocytes 0 %   Abs Immature Granulocytes 0.04 0.00 - 0.07 K/uL    Comment: Performed at [facility], [address], [place], [place] [address]  Comprehensive metabolic panel     Status: Abnormal   Collection Time: 04/25/18 10:43 PM  Result Value Ref Range   Sodium 139 135 - 145 mmol/L   Potassium 3.8 3.5 - 5.1 mmol/L   Chloride 101 98 - 111 mmol/L   CO2 26 22 - 32 mmol/L   Glucose, Bld 159 (H) 70 - 99 mg/dL   BUN 14 6 - 20 mg/dL   Creatinine, Ser 6.04 (H) 0.61 - 1.24 mg/dL   Calcium 9.4 8.9 - 54.0 mg/dL   Total Protein 8.6 (H) 6.5 - 8.1 g/dL   Albumin 4.8 3.5 - 5.0 g/dL   AST 29 15 - 41 U/L   ALT 53 (H) 0 - 44 U/L   Alkaline Phosphatase 99 38 - 126 U/L    Total Bilirubin 0.6 0.3 - 1.2 mg/dL   GFR calc non Af Amer >60 >60 mL/min   GFR calc Af Amer >60 >60 mL/min   Anion gap 12 5 - 15    Comment: Performed at [facility], [address] [place], [place] [address]  I-stat troponin, ED     Status: None   Collection Time: 04/25/18 10:45 PM  Result Value Ref Range   Troponin i, poc 0.01 0.00 - 0.08 ng/mL   Comment 3            Comment: Due to the release kinetics of cTnI, a negative result within the first hours of the onset of symptoms does not rule out myocardial infarction with certainty. If myocardial infarction is still suspected, repeat the test at appropriate intervals.   I-Stat Creatinine, ED (not at [facility])     Status: Abnormal   Collection Time: 04/25/18 10:47 PM  Result Value Ref Range   Creatinine, Ser 1.50 (H) 0.61 - 1.24 mg/dL  POCT I-Stat EG7     Status: Abnormal   Collection Time: 04/25/18 10:49 PM  Result Value Ref Range   pH, Ven 7.330 7.250 - 7.430   pCO2, Ven 51.7 44.0 - 60.0 mmHg   pO2, Ven 26.0 (LL) 32.0 - 45.0 mmHg   Bicarbonate 27.3 20.0 - 28.0 mmol/L   TCO2 29 22 - 32 mmol/L   O2 Saturation 42.0 %   Sodium 140 135 - 145 mmol/L   Potassium 4.0 3.5 - 5.1 mmol/L   Calcium, Ion 1.15 1.15 - 1.40 mmol/L   HCT 47.0 39.0 - 52.0 %   Hemoglobin 16.0 13.0 - 17.0 g/dL   Patient temperature HIDE    Sample type VENOUS    Comment NOTIFIED PHYSICIAN   C-reactive protein     Status: Abnormal   Collection Time: 04/25/18 10:56 PM  Result Value Ref Range   CRP 1.0 (H) <1.0 mg/dL    Comment: Performed at [facility], [address], [place], [place] [address]   Dg Chest Port 1 View  Result Date: 04/25/2018 CLINICAL DATA:  Shortness of breath EXAM: PORTABLE CHEST 1 VIEW COMPARISON:  02/26/2018 FINDINGS: Mild cardiomegaly. No acute airspace disease or effusion. No pneumothorax. IMPRESSION: No active disease.  Mild cardiomegaly which appears new. Electronically Signed   By: Jasmine Pang M.D.   On: 04/25/2018 22:59    Pending Labs [employer] (  From admission, onward)    Start     Ordered   04/25/18 2243  Sedimentation rate  ONCE - STAT,   STAT     04/25/18 2242          Vitals/Pain Today's Vitals   04/25/18 2235 04/25/18 2300 04/25/18 2330 04/25/18 2345  BP:  119/79 109/76 110/81  Pulse: (!) 228 (!) 113 (!) 118 99  Resp: (!) 26 18 15 16   SpO2: 100% 96% 97% 99%  PainSc:        Isolation Precautions No active isolations  Medications Medications  amiodarone (NEXTERONE) 1.8 mg/mL load via infusion 150 mg (has no administration in time range)    Followed by  amiodarone (NEXTERONE PREMIX) 360-4.14 MG/200ML-% (1.8 mg/mL) IV infusion (has no administration in time range)    Followed by  amiodarone (NEXTERONE PREMIX) 360-4.14 MG/200ML-% (1.8 mg/mL) IV infusion (30 mg/hr Intravenous New Bag/Given 04/25/18 2308)  amiodarone (NEXTERONE) 150-4.21 MG/100ML-% bolus (  Stopped 04/25/18 2259)    Mobility walks     Focused Assessments Neuro Assessment Handoff:  Swallow screen pass? Yes          Neuro Assessment:   Neuro Checks:      Last Documented NIHSS Modified Score:   Has TPA been given? No If patient is a Neuro Trauma and patient is going to OR before floor call report to 4N Charge nurse: [phone] or [phone]     R Recommendations: See Admitting Provider Note  Report given to:   Additional Notes:

## 2018-04-25 NOTE — ED Provider Notes (Signed)
[facility]-EMERGENCY DEPT Provider Note   CSN: [id number] Arrival date & time: 04/25/18  2219    History   Chief Complaint Chief Complaint  Patient presents with  . tachycardic    HPI Ryan Harper is a 33 y.o. male hx of pericarditis, aflutter here with tachycardia. Patient states that he had flu syndrome in January and then had pleuritic chest pain. He saw PCP and was diagnosed with pericarditis and started on colchicine. His echo showed no pericardial effusion and CRP was normal at that time. Patient states that he has not been taking his colchicine.  He was doing well until an hour prior to arrival.  He states that he was working and he had sudden onset of shortness of breath and palpitations.  He states that he also feels very dizzy and nauseated. Denies any recent travel or sick contacts. No fever or hx of CAD or MI.      The history is provided by the patient.    Past Medical History:  Diagnosis Date  . History of recurrent UTIs   . Pericarditis   . Screening for HIV (human immunodeficiency virus)     Patient Active Problem List   Diagnosis Date Noted  . Rapid atrial fibrillation (HCC) 04/25/2018    Past Surgical History:  Procedure Laterality Date  . TYMPANOSTOMY TUBE PLACEMENT    . TYMPANOSTOMY TUBE PLACEMENT  1992        Home Medications    Prior to Admission medications   Medication Sig Start Date End Date Taking? Authorizing Provider  acetaminophen (TYLENOL) 500 MG tablet Take 500 mg by mouth every 6 (six) hours as needed.   Yes [provider]  Multiple Vitamin (MULTIVITAMIN WITH MINERALS) TABS tablet Take 1 tablet by mouth daily.   Yes [provider]  colchicine 0.6 MG tablet Take 1 tablet (0.6 mg total) by mouth 2 (two) times daily. Patient not taking: Reported on 04/25/2018 03/01/18   Orland Mustard, MD  ibuprofen (ADVIL,MOTRIN) 800 MG tablet THREE TIMES A DAY for 10 days Patient not taking: Reported on  04/25/2018 03/01/18   Orland Mustard, MD  pantoprazole (PROTONIX) 40 MG tablet Take 1 tablet (40 mg total) by mouth daily. Patient not taking: Reported on 04/25/2018 03/01/18   Orland Mustard, MD    Family History Family History  Family history unknown: Yes    Social History Social History   Tobacco Use  . Smoking status: Never Smoker  . Smokeless tobacco: Never Used  Substance Use Topics  . Alcohol use: Yes    Comment: social  . Drug use: No     Allergies   Penicillins   Review of Systems Review of Systems  Cardiovascular: Positive for palpitations.  Neurological: Positive for dizziness.  All other systems reviewed and are negative.    Physical Exam Updated Vital Signs BP 109/76   Pulse (!) 118   Resp 15   SpO2 97%   Physical Exam Vitals signs and nursing note reviewed.  HENT:     Head: Normocephalic.     Nose: Nose normal.     Mouth/Throat:     Mouth: Mucous membranes are moist.  Eyes:     Extraocular Movements: Extraocular movements intact.     Pupils: Pupils are equal, round, and reactive to light.  Neck:     Musculoskeletal: Normal range of motion.  Cardiovascular:     Comments: Tachy, irregular  Pulmonary:     Breath sounds: Normal breath sounds.  Abdominal:     General: Abdomen is flat.     Palpations: Abdomen is soft.  Musculoskeletal: Normal range of motion.  Skin:    General: Skin is warm.     Capillary Refill: Capillary refill takes less than 2 seconds.  Neurological:     General: No focal deficit present.     Mental Status: He is alert and oriented to person, place, and time.  Psychiatric:        Mood and Affect: Mood normal.        Behavior: Behavior normal.      ED Treatments / Results  Labs (all labs ordered are listed, but only abnormal results are displayed) Labs Reviewed  CBC WITH DIFFERENTIAL/PLATELET - Abnormal; Notable for the following components:      Result Value   WBC 11.2 (*)    Platelets 403 (*)    Lymphs Abs  4.8 (*)    All other components within normal limits  COMPREHENSIVE METABOLIC PANEL - Abnormal; Notable for the following components:   Glucose, Bld 159 (*)    Creatinine, Ser 1.41 (*)    Total Protein 8.6 (*)    ALT 53 (*)    All other components within normal limits  C-REACTIVE PROTEIN - Abnormal; Notable for the following components:   CRP 1.0 (*)    All other components within normal limits  I-STAT CREATININE, ED - Abnormal; Notable for the following components:   Creatinine, Ser 1.50 (*)    All other components within normal limits  POCT I-STAT EG7 - Abnormal; Notable for the following components:   pO2, Ven 26.0 (*)    All other components within normal limits  SEDIMENTATION RATE  I-STAT TROPONIN, ED    EKG EKG Interpretation  Date/Time:  Wednesday April 25 2018 22:54:31 EDT Ventricular Rate:  111 PR Interval:    QRS Duration: 93 QT Interval:  422 QTC Calculation: 574 R Axis:   47 Text Interpretation:  Sinus or ectopic atrial tachycardia Repol abnrm, severe global ischemia (LM/MVD) Prolonged QT interval short PR, possible delta wave  Confirmed by Richardean Canal,  H ([id number]) on 04/25/2018 11:10:44 PM   Radiology Dg Chest Port 1 View  Result Date: 04/25/2018 CLINICAL DATA:  Shortness of breath EXAM: PORTABLE CHEST 1 VIEW COMPARISON:  02/26/2018 FINDINGS: Mild cardiomegaly. No acute airspace disease or effusion. No pneumothorax. IMPRESSION: No active disease.  Mild cardiomegaly which appears new. Electronically Signed   By: Jasmine PangKim  Fujinaga M.D.   On: 04/25/2018 22:59    Procedures Procedures (including critical care time)  CRITICAL CARE Performed by: Richardean Canalavid H    Total critical care time: 30 minutes  Critical care time was exclusive of separately billable procedures and treating other patients.  Critical care was necessary to treat or prevent imminent or life-threatening deterioration.  Critical care was time spent personally by me on the following activities:  development of treatment plan with patient and/or surrogate as well as nursing, discussions with consultants, evaluation of patient's response to treatment, examination of patient, obtaining history from patient or surrogate, ordering and performing treatments and interventions, ordering and review of laboratory studies, ordering and review of radiographic studies, pulse oximetry and re-evaluation of patient's condition.   Medications Ordered in ED Medications  amiodarone (NEXTERONE) 1.8 mg/mL load via infusion 150 mg (has no administration in time range)    Followed by  amiodarone (NEXTERONE PREMIX) 360-4.14 MG/200ML-% (1.8 mg/mL) IV infusion (has no administration in time range)    Followed by  amiodarone (  NEXTERONE PREMIX) 360-4.14 MG/200ML-% (1.8 mg/mL) IV infusion (30 mg/hr Intravenous New Bag/Given 04/25/18 2308)  amiodarone (NEXTERONE) 150-4.21 MG/100ML-% bolus (  Stopped 04/25/18 2259)     Initial Impression / Assessment and Plan / ED Course  I have reviewed the triage vital signs and the nursing notes.  Pertinent labs & imaging results that were available during my care of the patient were reviewed by me and considered in my medical decision making (see chart for details).        Ryan Harper is a 33 y.o. male here with palpitations. Patient appears in Vtach vs afib with aberancy. 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 Will get labs, give amiodarone.   11 pm HR down to 110 now. Repeat EKG showed flutter, possible delta waves. I talked to Dr. Allena Katz from cardiology. He recommend telemetry at [facility]. He will see patient on arrival and will admit.    Final Clinical Impressions(s) / ED Diagnoses   Final diagnoses:  None    ED Discharge Orders    None       Charlynne Pander, MD 04/25/18 2349

## 2018-04-25 NOTE — ED Triage Notes (Signed)
Pt complains of being short of breath and a fast heartrate, he states that he had the flu and pericarditis one month ago Pt states that he feels very dizzy and nauseated This started about 30 minutes ago

## 2018-04-26 DIAGNOSIS — I483 Typical atrial flutter: Secondary | ICD-10-CM

## 2018-04-26 DIAGNOSIS — I499 Cardiac arrhythmia, unspecified: Secondary | ICD-10-CM | POA: Diagnosis present

## 2018-04-26 DIAGNOSIS — Z791 Long term (current) use of non-steroidal anti-inflammatories (NSAID): Secondary | ICD-10-CM | POA: Diagnosis not present

## 2018-04-26 DIAGNOSIS — Z79899 Other long term (current) drug therapy: Secondary | ICD-10-CM | POA: Diagnosis not present

## 2018-04-26 DIAGNOSIS — Z880 Allergy status to penicillin: Secondary | ICD-10-CM | POA: Diagnosis not present

## 2018-04-26 DIAGNOSIS — I4891 Unspecified atrial fibrillation: Secondary | ICD-10-CM | POA: Diagnosis present

## 2018-04-26 DIAGNOSIS — I4892 Unspecified atrial flutter: Secondary | ICD-10-CM | POA: Diagnosis not present

## 2018-04-26 DIAGNOSIS — I472 Ventricular tachycardia: Secondary | ICD-10-CM | POA: Diagnosis not present

## 2018-04-26 LAB — SEDIMENTATION RATE: SED RATE: 5 mm/h (ref 0–16)

## 2018-04-26 MED ORDER — APIXABAN 5 MG PO TABS: 5.0000 mg | ORAL_TABLET | Freq: Two times a day (BID) | ORAL | 3 refills | Status: DC

## 2018-04-26 MED ORDER — DILTIAZEM HCL 30 MG PO TABS
30.0000 mg | ORAL_TABLET | Freq: Four times a day (QID) | ORAL | Status: DC
  Administered 2018-04-26: 30 mg via ORAL
  Filled 2018-04-26: qty 1

## 2018-04-26 MED ORDER — APIXABAN 5 MG PO TABS
5.0000 mg | ORAL_TABLET | Freq: Two times a day (BID) | ORAL | Status: DC
  Administered 2018-04-26 (×2): 5 mg via ORAL
  Filled 2018-04-26 (×2): qty 1

## 2018-04-26 MED ORDER — ONDANSETRON HCL 4 MG/2ML IJ SOLN: 4.0000 mg | Freq: Four times a day (QID) | INTRAMUSCULAR | Status: DC | PRN

## 2018-04-26 MED ORDER — DILTIAZEM HCL 30 MG PO TABS: 30.0000 mg | ORAL_TABLET | ORAL | 1 refills | Status: DC | PRN

## 2018-04-26 MED ORDER — DILTIAZEM HCL ER COATED BEADS 180 MG PO CP24: 180.0000 mg | ORAL_CAPSULE | Freq: Every day | ORAL | 3 refills | Status: DC

## 2018-04-26 MED ORDER — DILTIAZEM HCL ER COATED BEADS 180 MG PO CP24
180.0000 mg | ORAL_CAPSULE | Freq: Every day | ORAL | Status: DC
  Administered 2018-04-26: 180 mg via ORAL
  Filled 2018-04-26: qty 1

## 2018-04-26 MED ORDER — ACETAMINOPHEN 325 MG PO TABS: 650.0000 mg | ORAL_TABLET | ORAL | Status: DC | PRN

## 2018-04-26 NOTE — Consult Note (Addendum)
Cardiology Consultation:   Patient ID: Ryan Harper MRN: [id number]; DOB: [birth year]  Admit date: 04/25/2018 Date of Consult: 04/26/2018  Primary Care Provider: Orma Flaming, MD Primary Cardiologist: No primary care provider on file.  Primary Electrophysiologist:  None    Patient Profile:   Ryan Harper is a 33 y.o. male with no PMHx until this January, he developed cold/flu like symptoms found with AFlutter and presumptive dx of pericarditis to have been seen by cardiology though did not, who is being seen today for the evaluation of AFlutter w/RVR at the request of Dr. Richwood Lions.  History of Present Illness:   Mr. Ryan Harper has h/o a cold/flu like illness in January, feeling poorly (taking OTC remedies) he developed chest heaviness, palpitations and sought attention with his PMD.  He was found in rate controlled AFlutter, suspected to have pericarditis and started on NSAIDs and colchicine.   The patient reports that his chest pressure was clearly worse laying back better sitting up and when squatting down or on his knees.  The medicines did not seem to help much but this did eventually resolve and feeling better since then.  Yesterday he was at work, had been feeling "totally fine" when he bent over to pick up something felt his heart rate fast, this persisted, developed SOB, weakness and drove himself to [place].  There he was found in WCT 230's given amio bolus >> gtt gaining rate control noting AFlutter and transferred to [facility]. H&P reports upon arrival to [facility] had some brief SR, though returned to AFlutter.  Since here he feels well.  He is currently unaware of his flutter (Rate controlled 70-80's), and reports feeling "totally fine".  He has some throat irritation that he gets with the season change ery year, no cough, SOB, fever, no recent travel.  LABS K+ 3.8 BUN/Creat 14/1.41 WBC 11.2 H/H 15/46 Plts 403 CRP 1.0 ESR 5  03/02/2018: TSH 1.19    Past Medical  History:  Diagnosis Date  . History of recurrent UTIs   . Pericarditis   . Screening for HIV (human immunodeficiency virus)     Past Surgical History:  Procedure Laterality Date  . TYMPANOSTOMY TUBE PLACEMENT    . TYMPANOSTOMY TUBE PLACEMENT  1992     Home Medications:  Prior to Admission medications   Medication Sig Start Date End Date Taking? Authorizing Provider  acetaminophen (TYLENOL) 500 MG tablet Take 500 mg by mouth every 6 (six) hours as needed.   Yes [provider]  Multiple Vitamin (MULTIVITAMIN WITH MINERALS) TABS tablet Take 1 tablet by mouth daily.   Yes [provider]  colchicine 0.6 MG tablet Take 1 tablet (0.6 mg total) by mouth 2 (two) times daily. Patient not taking: Reported on 04/25/2018 03/01/18   Orma Flaming, MD  ibuprofen (ADVIL,MOTRIN) 800 MG tablet THREE TIMES A DAY for 10 days Patient not taking: Reported on 04/25/2018 03/01/18   Orma Flaming, MD  pantoprazole (PROTONIX) 40 MG tablet Take 1 tablet (40 mg total) by mouth daily. Patient not taking: Reported on 04/25/2018 03/01/18   Orma Flaming, MD    Inpatient Medications: Scheduled Meds: . apixaban  5 mg Oral BID  . diltiazem  180 mg Oral Daily  . diltiazem  30 mg Oral Q6H   Continuous Infusions:  PRN Meds: acetaminophen, ondansetron (ZOFRAN) IV  Allergies:    Allergies  Allergen Reactions  . Penicillins Other (See Comments)    Did it involve swelling of the face/tongue/throat, SOB, or low BP? Unknown  Did it involve sudden or severe rash/hives, skin peeling, or any reaction on the inside of your mouth or nose? Unknown Did you need to seek medical attention at a hospital or doctor's office? Unknown When did it last happen?always told not to take If all above answers are "NO", may proceed with cephalosporin use.     Social History:   Social History   Socioeconomic History  . Marital status: Single    Spouse name: Not on file  . Number of children: Not on file   . Years of education: Not on file  . Highest education level: Not on file  Occupational History  . Not on file  Social Needs  . Financial resource strain: Not on file  . Food insecurity:    Worry: Not on file    Inability: Not on file  . Transportation needs:    Medical: Not on file    Non-medical: Not on file  Tobacco Use  . Smoking status: Never Smoker  . Smokeless tobacco: Never Used  Substance and Sexual Activity  . Alcohol use: Yes    Comment: social  . Drug use: No  . Sexual activity: Yes  Lifestyle  . Physical activity:    Days per week: Not on file    Minutes per session: Not on file  . Stress: Not on file  Relationships  . Social connections:    Talks on phone: Not on file    Gets together: Not on file    Attends religious service: Not on file    Active member of club or organization: Not on file    Attends meetings of clubs or organizations: Not on file    Relationship status: Not on file  . Intimate partner violence:    Fear of current or ex partner: Not on file    Emotionally abused: Not on file    Physically abused: Not on file    Forced sexual activity: Not on file  Other Topics Concern  . Not on file  Social History Narrative  . Not on file    Family History:   Family History  Family history unknown: Yes     ROS:  Please see the history of present illness.  All other ROS reviewed and negative.     Physical Exam/Data:   Vitals:   04/26/18 0030 04/26/18 0121 04/26/18 0356 04/26/18 0843  BP: 110/76 118/77 108/68 111/60  Pulse: 91 94 64 91  Resp: 15 16 18 18   Temp:  98.4 F (36.9 C) 98.2 F (36.8 C) 97.8 F (36.6 C)  TempSrc:  Oral Oral Oral  SpO2: 99% 99% 97% 99%  Weight:  118.4 kg    Height:  6' (1.829 m)      Intake/Output Summary (Last 24 hours) at 04/26/2018 0910 Last data filed at 04/26/2018 0846 Gross per 24 hour  Intake 656.88 ml  Output 800 ml  Net -143.12 ml   Last 3 Weights 04/26/2018 03/01/2018 02/26/2018  Weight (lbs)  261 lb 243 lb 9.6 oz 243 lb 12 oz  Weight (kg) 118.389 kg 110.496 kg 110.564 kg     Body mass index is 35.4 kg/m.  General:  Well nourished, well developed, in no acute distress HEENT: normal Lymph: no adenopathy Neck: no JVD Endocrine:  No thryomegaly Vascular: No carotid bruits  Cardiac:  irreg-irreg; no murmurs, gallops or rubs Lungs:  CTA b/l, no wheezing, rhonchi or rales  Abd: soft, nontender  Ext: no edema Musculoskeletal:  No deformities  Skin: warm and dry  Neuro:  No gross focal abnormalities noted Psych:  Normal affect   EKG:  The EKG was personally reviewed and demonstrates:    #1 WCT 231bpm AFlutter 111bpm Today is AFlutter 68bpm, appears typical  Telemetry:  Telemetry was personally reviewed and demonstrates:   AFlutter (likely typical), rates 70's-80's  Relevant CV Studies:  03/02/2018: TTE Study Conclusions - Left ventricle: The cavity size was normal. Wall thickness was   normal. Systolic function was normal. The estimated ejection   fraction was in the range of 55% to 60%. Wall motion was normal;   there were no regional wall motion abnormalities. The study is   not technically sufficient to allow evaluation of LV diastolic   function. - Left atrium: The atrium was mildly dilated. - Right atrium: The atrium was mildly dilated. - Atrial septum: No defect or patent foramen ovale was identified.  Laboratory Data:  Chemistry Recent Labs  Lab 04/25/18 2243 04/25/18 2247 04/25/18 2249  NA 139  --  140  K 3.8  --  4.0  CL 101  --   --   CO2 26  --   --   GLUCOSE 159*  --   --   BUN 14  --   --   CREATININE 1.41* 1.50*  --   CALCIUM 9.4  --   --   GFRNONAA >60  --   --   GFRAA >60  --   --   ANIONGAP 12  --   --     Recent Labs  Lab 04/25/18 2243  PROT 8.6*  ALBUMIN 4.8  AST 29  ALT 53*  ALKPHOS 99  BILITOT 0.6   Hematology Recent Labs  Lab 04/25/18 2243 04/25/18 2249  WBC 11.2*  --   RBC 5.18  --   HGB 15.9 16.0  HCT 46.4 47.0   MCV 89.6  --   MCH 30.7  --   MCHC 34.3  --   RDW 12.3  --   PLT 403*  --    Cardiac EnzymesNo results for input(s): TROPONINI in the last 168 hours.  Recent Labs  Lab 04/25/18 2245  TROPIPOC 0.01    BNPNo results for input(s): BNP, PROBNP in the last 168 hours.  DDimer No results for input(s): DDIMER in the last 168 hours.  Radiology/Studies:   Dg Chest Port 1 View Result Date: 04/25/2018 CLINICAL DATA:  Shortness of breath EXAM: PORTABLE CHEST 1 VIEW COMPARISON:  02/26/2018 FINDINGS: Mild cardiomegaly. No acute airspace disease or effusion. No pneumothorax. IMPRESSION: No active disease.  Mild cardiomegaly which appears new. Electronically Signed   By: Donavan Foil M.D.   On: 04/25/2018 22:59    Assessment and Plan:   1. AFlutter w/RVR     WCT likely 1:1 conduction     CHA2DS2Vasc is zero, started on Eliquis on arrival  He is asymptomatic with rate controlled AFlutter here, he had clear onset of symptoms when in RVR, though can not say he has not been in AFlutter rate controlled prior to that for an unknown duration, there are no EKGs in SR since January.  Dr. Curt Bears discussed at length with the patient, Felishia Wartman plan for anticoagulation and out patient follow up with plans for ablation once able to do elective procedures.  Shari Natt start diltiazem PO, stop his amiodarone, ambulate and plan for discharge if rate controlled off gtt.   For questions or updates, please contact [place] Please consult [email or web address] for contact info  under     Signed, Baldwin Jamaica, PA-C  04/26/2018 9:10 AM  I have seen and examined this patient with Tommye Standard.  Agree with above, note added to reflect my findings.  On exam, RRR, no murmurs, lungs clear.  Patient mid to the hospital after an episode of wide-complex tachycardia.  He was put on amiodarone which slowed his tachycardia and revealed atrial flutter.  He remains in atrial flutter today despite an amiodarone drip.  At this point it  is unclear to me how long he has been in atrial flutter.  Thus a rhythm control strategy would not be ideal.  He is stable and feeling well with heart rates in the 70s to 80s on an amiodarone drip.  We Buryl Bamber put him on p.o. diltiazem, and if his heart rate does not increase significantly, Maybel Dambrosio plan for discharge with follow-up in clinic.  We Marisue Canion also start Eliquis today.  Estiven Kohan M. Emmamarie Kluender MD 04/26/2018 12:47 PM

## 2018-04-26 NOTE — ED Notes (Signed)
Attempted to call report. Rn unavailable at this time.  

## 2018-04-26 NOTE — ED Notes (Signed)
Report given, [facility] contacted, Paperwork printed.

## 2018-04-26 NOTE — Progress Notes (Signed)
Patient is being discharge home. Benedetto Goad called and should be in the valet entrance at 1500.  Patients easy-read print out and information on Diltiazem and Eliquis medications given for pt to read. As well as atrial fibrillation easy-read print out given print out from references tab.  Patients questions were answered, no further questions at this time. Peripheral IVs removed, clean dry and intact, dressing and pressure applied. Patients belongings with patient: wallet, phone and clothes.

## 2018-04-26 NOTE — H&P (Signed)
Cardiology History & Physical    Patient ID: Ryan Harper MRN: [id number], DOB: 07-21-1985 Date of Encounter: 04/26/2018, 12:01 AM Primary Physician: Orland Mustard, MD  Chief Complaint: Palpitations   HPI: Ryan Harper is a 33 y.o. male with history of presumed pericarditis, atrial flutter, who presents with palpitations.  Pt had a flu-like illness in January of this year that was accompanied by chest pain.  He was seen by his PCP and given a presumptive diagnosis of pericarditis.  Echo at the time showed normal LV systolic function and no major valvular issues.  He was given NSAIDs and colchicine at the time, but has not been taking this recently.  His ECG showed atrial flutter and he was set up to be seen by cardiology.  Today, pt had the sudden onset of palpitations and SOB, which was accompanied by lightheadedness and nausea.  He was not had any recent travel, sick contacts or fevers.  [facility] for [facility].  He was found to be in a wide complex tachycardia with rates in the 230s.  He was given a bolus dose of IV amiodarone and started on an amiodarone drip, with improvement in his HR to the 110s, in what appears to be atrial flutter.  He was then transferred to [facility] for further management.  Upon arrival to [facility], pt had converted to NSR briefly, but is now back in atrial flutter.  Past Medical History:  Diagnosis Date  . History of recurrent UTIs   . Pericarditis   . Screening for HIV (human immunodeficiency virus)      Surgical History:  Past Surgical History:  Procedure Laterality Date  . TYMPANOSTOMY TUBE PLACEMENT    . TYMPANOSTOMY TUBE PLACEMENT  1992     Home Meds: Prior to Admission medications   Medication Sig Start Date End Date Taking? Authorizing Provider  acetaminophen (TYLENOL) 500 MG tablet Take 500 mg by mouth every 6 (six) hours as needed.   Yes [provider]  Multiple Vitamin (MULTIVITAMIN WITH MINERALS) TABS tablet Take 1 tablet  by mouth daily.   Yes [provider]  colchicine 0.6 MG tablet Take 1 tablet (0.6 mg total) by mouth 2 (two) times daily. Patient not taking: Reported on 04/25/2018 03/01/18   Orland Mustard, MD  ibuprofen (ADVIL,MOTRIN) 800 MG tablet THREE TIMES A DAY for 10 days Patient not taking: Reported on 04/25/2018 03/01/18   Orland Mustard, MD  pantoprazole (PROTONIX) 40 MG tablet Take 1 tablet (40 mg total) by mouth daily. Patient not taking: Reported on 04/25/2018 03/01/18   Orland Mustard, MD    Allergies:  Allergies  Allergen Reactions  . Penicillins Other (See Comments)    Did it involve swelling of the face/tongue/throat, SOB, or low BP? Unknown Did it involve sudden or severe rash/hives, skin peeling, or any reaction on the inside of your mouth or nose? Unknown Did you need to seek medical attention at a hospital or doctor's office? Unknown When did it last happen?always told not to take If all above answers are "NO", may proceed with cephalosporin use.     Social History   Socioeconomic History  . Marital status: Single    Spouse name: Not on file  . Number of children: Not on file  . Years of education: Not on file  . Highest education level: Not on file  Occupational History  . Not on file  Social Needs  . Financial resource strain: Not on file  .  Food insecurity:    Worry: Not on file    Inability: Not on file  . Transportation needs:    Medical: Not on file    Non-medical: Not on file  Tobacco Use  . Smoking status: Never Smoker  . Smokeless tobacco: Never Used  Substance and Sexual Activity  . Alcohol use: Yes    Comment: social  . Drug use: No  . Sexual activity: Yes  Lifestyle  . Physical activity:    Days per week: Not on file    Minutes per session: Not on file  . Stress: Not on file  Relationships  . Social connections:    Talks on phone: Not on file    Gets together: Not on file    Attends religious service: Not on file    Active member of  club or organization: Not on file    Attends meetings of clubs or organizations: Not on file    Relationship status: Not on file  . Intimate partner violence:    Fear of current or ex partner: Not on file    Emotionally abused: Not on file    Physically abused: Not on file    Forced sexual activity: Not on file  Other Topics Concern  . Not on file  Social History Narrative  . Not on file     Family History  Family history unknown: Yes    Review of Systems: All other systems reviewed and are otherwise negative except as noted above.  Labs:   Lab Results  Component Value Date   WBC 11.2 (H) 04/25/2018   HGB 16.0 04/25/2018   HCT 47.0 04/25/2018   MCV 89.6 04/25/2018   PLT 403 (H) 04/25/2018    Recent Labs  Lab 04/25/18 2243 04/25/18 2247 04/25/18 2249  NA 139  --  140  K 3.8  --  4.0  CL 101  --   --   CO2 26  --   --   BUN 14  --   --   CREATININE 1.41* 1.50*  --   CALCIUM 9.4  --   --   PROT 8.6*  --   --   BILITOT 0.6  --   --   ALKPHOS 99  --   --   ALT 53*  --   --   AST 29  --   --   GLUCOSE 159*  --   --    No results for input(s): CKTOTAL, CKMB, TROPONINI in the last 72 hours. Lab Results  Component Value Date   CHOL 233 (H) 12/18/2017   HDL 48.60 12/18/2017   LDLCALC 147 (H) 12/18/2017   TRIG 191.0 (H) 12/18/2017   No results found for: DDIMER  Radiology/Studies:  Dg Chest Port 1 View  Result Date: 04/25/2018 CLINICAL DATA:  Shortness of breath EXAM: PORTABLE CHEST 1 VIEW COMPARISON:  02/26/2018 FINDINGS: Mild cardiomegaly. No acute airspace disease or effusion. No pneumothorax. IMPRESSION: No active disease.  Mild cardiomegaly which appears new. Electronically Signed   By: Jasmine Pang M.D.   On: 04/25/2018 22:59   Wt Readings from Last 3 Encounters:  03/01/18 110.5 kg  02/26/18 110.6 kg  12/18/17 111.6 kg    EKG: Initially WCT with rates of 230s, subsequently, possible atrial flutter, rates 110s.  Physical Exam: Blood pressure  110/81, pulse 99, resp. rate 16, SpO2 99 %. There is no height or weight on file to calculate BMI. General: Well developed, well nourished, in no acute  distress. Head: Normocephalic, atraumatic, sclera non-icteric, no xanthomas, nares are without discharge.  Neck: Negative for carotid bruits. JVD not elevated. Lungs: Clear bilaterally to auscultation without wheezes, rales, or rhonchi. Breathing is unlabored. Heart: RRR with S1 S2. No murmurs, rubs, or gallops appreciated. Abdomen: Soft, non-tender, non-distended with normoactive bowel sounds. No hepatomegaly. No rebound/guarding. No obvious abdominal masses. Msk:  Strength and tone appear normal for age. Extremities: No clubbing or cyanosis. No edema.  Distal pedal pulses are 2+ and equal bilaterally. Neuro: Alert and oriented X 3. No focal deficit. No facial asymmetry. Moves all extremities spontaneously. Psych:  Responds to questions appropriately with a normal affect.    Assessment and Plan  20M with known atrial flutter who presents with palpitations and was found to have WCT initially.   1. WCT:  Given previously known atrial flutter and normal heart, atrial flutter with 1:1 conduction seems most likely.  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  He was started on IV amiodarone in the ED; given conversion to NSR earlier will continue this for a brief course.  Will also start fractionated PO diltiazem alongside. Plan to start apixaban for therapeutic anticoagulation given  atrial flutter.  Plan for EP consult in AM; he would be a good candidate for flutter ablation at some point down the road.  He has had a recent unremarkable echo, so will defer on TTE at present.  Signed, Esmond Plants, MD 04/26/2018, 12:01 AM

## 2018-04-26 NOTE — Plan of Care (Signed)
  Problem: Cardiac: Goal: Ability to achieve and maintain adequate cardiopulmonary perfusion will improve Outcome: Progressing   Problem: Safety: Goal: Ability to remain free from injury will improve Outcome: Progressing   

## 2018-04-26 NOTE — Progress Notes (Addendum)
Patient ambulated in hallway. Pt denied shortness of breath. Heart rate in monitor:highest 130 nonsustained while walking now 73.  Patient is currently lying in bed, denies shortness of breath and chest pain.   Vitals are as follows:   04/26/18 1228  Vitals  Temp 98.3 F (36.8 C)  Temp Source Oral  BP 118/74  MAP (mmHg) 88  BP Method Automatic  Pulse Rate 93  Pulse Rate Source Monitor  Resp 19  Oxygen Therapy  SpO2 99 %  O2 Device Room Air  MEWS Score  MEWS RR 0  MEWS Pulse 0  MEWS Systolic 0  MEWS LOC 0  MEWS Temp 0  MEWS Score 0  MEWS Score Color Green

## 2018-04-26 NOTE — ED Notes (Signed)
[facility] at bedside 

## 2018-04-26 NOTE — Progress Notes (Signed)
EKG completed and placed in patient's chart

## 2018-04-26 NOTE — Discharge Summary (Addendum)
DISCHARGE SUMMARY    Patient ID: Ryan Harper,  MRN: [id number], DOB/AGE: May 11, 1985 33 y.o.  Admit date: 04/25/2018 Discharge date: 04/26/2018  Primary Care Physician: Orland Mustard, MD  Primary Cardiologist: none Electrophysiologist: new to Dr. Elberta Fortis  Primary Discharge Diagnosis:  1. AFlutter w/RVR     CHA2DS2Vasc is zero  Secondary Discharge Diagnosis:  none  Allergies  Allergen Reactions  . Penicillins Other (See Comments)    Did it involve swelling of the face/tongue/throat, SOB, or low BP? Unknown Did it involve sudden or severe rash/hives, skin peeling, or any reaction on the inside of your mouth or nose? Unknown Did you need to seek medical attention at a hospital or doctor's office? Unknown When did it last happen?always told not to take If all above answers are "NO", may proceed with cephalosporin use.      Procedures This Admission:  None   Brief HPI: Ryan Harper is a 33 y.o. male with no PMHx until this January, he developed cold/flu like symptoms found with AFlutter and presumptive dx of pericarditis.  he was at work, had been feeling "totally fine" when he bent over to pick up something felt his heart rate fast, this persisted, developed SOB, weakness and drove himself to [facility] hospital.  There he was found in WCT 230's given amio bolus >> gtt gaining rate control noting AFlutter and transferred to [facility].   Hospital Course:  The patient was admitted maintained on amiodarone gtt with CVR.  His history he reports he h/o a cold/flu like illness in January, feeling poorly (taking OTC remedies) he developed chest heaviness, palpitations and sought attention with his PMD.  He was found in rate controlled AFlutter, suspected to have pericarditis and started on NSAIDs and colchicine.   The patient reports that his chest pressure was clearly worse laying back better sitting up and when squatting down or on his knees.  The medicines did not seem to help  much but this did eventually resolve and feeling better since then.  He was never seen by cardiology service.  Yesterday he was at work, had been feeling "totally fine" when he bent over to pick up something felt his heart rate fast, this persisted, developed SOB, weakness and drove himself to [facility] hospital.  There he was found in WCT 230's given amio bolus >> gtt gaining rate control noting AFlutter and transferred to [facility]. H&P reports upon arrival to [facility] had some brief SR, though returned to AFlutter, though we were unable to find any.  TTE in Jan was reviewed, not felt to need another.  LVEF 55-60%, no significant VHD.  With HR control the patient is asymptomatic and unaware of his flutter and was decided that we could not be certain the onset of his flutter.  With no clear evidence of SR in between Jan and now.  Was decided to start PO rate control with daily and PRN diltiazem, continue Eliquis and follow up outpatient to plan for EPS/ablation.  Off amiodarone post PO dilt the patient's resting HR remains 70's, ambulating 90's. The patient was counseled on the importance of his medicine compliance, and with his PRN diltiazem, not to exceed 3 dose, if his HR does not imorove to seek attention  The patient was examined by Dr. Elberta Fortis and considered stable for discharge to home.  F/U is in place   Physical Exam: Vitals:   04/26/18 0121 04/26/18 0356 04/26/18 0843 04/26/18 1228  BP: 118/77 108/68 111/60 118/74  Pulse:  94 64 91 93  Resp: 16 18 18 19   Temp: 98.4 F (36.9 C) 98.2 F (36.8 C) 97.8 F (36.6 C) 98.3 F (36.8 C)  TempSrc: Oral Oral Oral Oral  SpO2: 99% 97% 99% 99%  Weight: 118.4 kg     Height: 6' (1.829 m)        Labs:   Lab Results  Component Value Date   WBC 11.2 (H) 04/25/2018   HGB 16.0 04/25/2018   HCT 47.0 04/25/2018   MCV 89.6 04/25/2018   PLT 403 (H) 04/25/2018    Recent Labs  Lab 04/25/18 2243 04/25/18 2247 04/25/18 2249  NA 139  --  140  K 3.8  --  4.0   CL 101  --   --   CO2 26  --   --   BUN 14  --   --   CREATININE 1.41* 1.50*  --   CALCIUM 9.4  --   --   PROT 8.6*  --   --   BILITOT 0.6  --   --   ALKPHOS 99  --   --   ALT 53*  --   --   AST 29  --   --   GLUCOSE 159*  --   --     Discharge Medications:  Allergies as of 04/26/2018      Reactions   Penicillins Other (See Comments)   Did it involve swelling of the face/tongue/throat, SOB, or low BP? Unknown Did it involve sudden or severe rash/hives, skin peeling, or any reaction on the inside of your mouth or nose? Unknown Did you need to seek medical attention at a hospital or doctor's office? Unknown When did it last happen?always told not to take If all above answers are "NO", may proceed with cephalosporin use.      Medication List    STOP taking these medications   colchicine 0.6 MG tablet   ibuprofen 800 MG tablet Commonly known as:  ADVIL,MOTRIN   pantoprazole 40 MG tablet Commonly known as:  PROTONIX     TAKE these medications   acetaminophen 500 MG tablet Commonly known as:  TYLENOL Take 500 mg by mouth every 6 (six) hours as needed.   apixaban 5 MG Tabs tablet Commonly known as:  ELIQUIS Take 1 tablet (5 mg total) by mouth 2 (two) times daily.   diltiazem 180 MG 24 hr capsule Commonly known as:  CARDIZEM CD Take 1 capsule (180 mg total) by mouth daily. Start taking on:  April 27, 2018   diltiazem 30 MG tablet Commonly known as:  Cardizem Take 1 tablet (30 mg total) by mouth every 2 (two) hours as needed (for palpitations, do not exceed 3 doses in a day).   multivitamin with minerals Tabs tablet Take 1 tablet by mouth daily.       Disposition: Home  Discharge Instructions    Diet - low sodium heart healthy   Complete by:  As directed    Increase activity slowly   Complete by:  As directed      Follow-up Information    Regan Lemming, MD Follow up.   Specialty:  Cardiology Why:  05/22/2018 @ 3:45PM Contact information:  [address] [place] [address] [phone]           Duration of Discharge Encounter: Greater than 30 minutes including physician time.  Norma Fredrickson, PA-C 04/26/2018 12:52 PM   I have seen and examined this  patient with Francis Dowse.  Agree with above, note added to reflect my findings.  On exam, RRR, no murmurs, lungs clear. Patient remains in atrial flutter though rate controlled. Plan for discharge today on eliquis and plan for clinic follow up and likely ablation.    Martel Galvan M. Lasaro Primm MD 04/26/2018 1:30 PM

## 2018-04-30 ENCOUNTER — Telehealth: Payer: Self-pay

## 2018-04-30 NOTE — Telephone Encounter (Signed)
Attempted to contact pt for COVID-19 pre-screen. Left message to call back.

## 2018-05-01 ENCOUNTER — Ambulatory Visit: Payer: Managed Care, Other (non HMO) | Admitting: Cardiology

## 2018-05-01 ENCOUNTER — Telehealth: Payer: Self-pay | Admitting: Cardiology

## 2018-05-01 NOTE — Telephone Encounter (Signed)
Attempted to contact pt x 2 for COVID-19 pre-screen. Left message to call back.

## 2018-05-01 NOTE — Telephone Encounter (Signed)
   Primary Cardiologist:  Jodelle Red (new)  Patient contacted.  History reviewed.  No symptoms to suggest any unstable cardiac conditions.  Based on discussion, with current pandemic situation, we will be postponing this appointment for [name] with a plan for f/u in 6-12 wks or sooner if feasible/necessary.  If symptoms change, he has been instructed to contact our office.   Routing to C19 CANCEL pool for tracking (P CV DIV CV19 CANCEL - reason for visit "other.") and assigning priority (1 = 4-6 wks, 2 = 6-12 wks, 3 = >12 wks).      Cardiac Questionnaire:   Since your last visit or hospitalization:   1. Have you been having new or worsening chest pain? no   2. Have you been having new or worsening shortness of breath? no 3. Have you been having new or worsening leg swelling, wt gain, or increase in abdominal girth (pants fitting more tightly)? no   4. Have you had any passing out spells? no    *A YES to any of these questions would result in the appointment being kept. *If all the answers to these questions are NO, we should indicate that given the current situation regarding the worldwide coronarvirus pandemic, at the recommendation of the CDC, we are looking to limit gatherings in our waiting area, and thus will reschedule their appointment beyond four weeks from today.     Jodelle Red, MD  05/01/2018 11:41 AM   Length of call 21 minutes Reviewed his initial history, symptoms, visit with Dr. Artis Flock. Reviewed his recent symptoms and hospitalizations at length. Reviewd atrial flutter, anatomy, how it is managed, what ablation is, why we use antocoagulants and rate control agents. He has naturally discovered that vagal maneuvers give him some brief relief from symptoms. Tolerating apixaban and diltiazem.   We reviewed options at length. I agree with Dr. Elberta Fortis that an ablation is the best long term option. I did discuss with him that we are not sure when we  will be returning to elective procedures. He has an appt (as of now) scheduled with Dr. Elberta Fortis in several weeks. I encouraged him to keep this if it is not rescheduled.  Offered my availability by phone for any questions or concerns.        [name]

## 2018-05-11 ENCOUNTER — Telehealth: Payer: Self-pay | Admitting: *Deleted

## 2018-05-11 NOTE — Telephone Encounter (Signed)
Called patient to let them know due to recent COVID19 CDC and Health Department Protocols, we are not seeing patients in the office. We are instead seeing if they would like to schedule this appointment as a WebEx Virtual Appointment VIA Smartphone or Laptop. Unable to reach patient. LVMTCB  

## 2018-05-16 NOTE — Telephone Encounter (Signed)
LVMTCB

## 2018-05-18 NOTE — Telephone Encounter (Signed)
                                1. Confirm consent - Calling patient to let them know due to recent COVID19 CDC and Health Department Protocols, we are not seeing patients in the office. We are instead seeing if they would like to schedule this appointment as a  Virtual Appointment VIA Smartphone or Laptop. Patient is aware if they decide to reschedule this appointment, they may not be seen or scheduled for the next 4-6 months. As with many in-office visits we must obtain consent. If you'd like, we can send this to patient's mychart (if signed up).  Otherwise, we can obtain verbal consent now.  All virtual visits are billed to patient's insurance company just like a normal visit.  By patient agreeing to a virtual visit, patient is aware that the technology does not allow provider to perform a hands on physical examination, and thus may limit your provider's ability to fully assess your condition.  Patient is aware we cannot assure that it will always work on either patient or our end, and in the setting of a video visit, we may have to convert it to a phone-only visit.  In either situation, we cannot ensure that we have a secure connection. Is patient willing to proceed?  YES   2. Give patient instructions for WebEx/Doximity download to smartphone as below if video visit   3. Patient advised to be prepared with any vital sign or heart rhythm information, their current medicines, and paper and pen handy for any instructions they may receive before, during, or after their visit.   4. Patient informed they will receive a phone call 10-15 minutes prior to their appointment time (may be from unknown caller ID) so they should be prepared to answer    ACCEPTING  DOXIMITY VIDEO VISITS  - Patient will receive a text message from a Random Number indicating Provider's Name and a Blue Hyperlink.  Text Message will say Provider sent you a secure message with a Blue Hyperlink Attached  -Patient is to click on  Blue Hyperlink Text message will say "Provider will see you now. Click to join Video call." with another Blue Hyperlink attached.  -Patient is to click Blue hyperlink to Join Video call with said Provider.             

## 2018-05-22 ENCOUNTER — Telehealth (INDEPENDENT_AMBULATORY_CARE_PROVIDER_SITE_OTHER): Payer: Managed Care, Other (non HMO) | Admitting: Cardiology

## 2018-05-22 ENCOUNTER — Telehealth: Payer: Self-pay | Admitting: *Deleted

## 2018-05-22 ENCOUNTER — Encounter: Payer: Self-pay | Admitting: Cardiology

## 2018-05-22 ENCOUNTER — Other Ambulatory Visit: Payer: Self-pay

## 2018-05-22 DIAGNOSIS — Z7901 Long term (current) use of anticoagulants: Secondary | ICD-10-CM

## 2018-05-22 DIAGNOSIS — I483 Typical atrial flutter: Secondary | ICD-10-CM

## 2018-05-22 DIAGNOSIS — Z7189 Other specified counseling: Secondary | ICD-10-CM

## 2018-05-22 MED ORDER — DILTIAZEM HCL ER COATED BEADS 180 MG PO CP24: 180.0000 mg | ORAL_CAPSULE | Freq: Every day | ORAL | 3 refills | Status: DC

## 2018-05-22 MED ORDER — APIXABAN 5 MG PO TABS: 5.0000 mg | ORAL_TABLET | Freq: Two times a day (BID) | ORAL | 3 refills | Status: DC

## 2018-05-22 NOTE — Patient Instructions (Signed)
Medication Instructions:    Your physician recommends that you continue on your current medications as directed. Please refer to the Current Medication list given to you today.  - If you need a refill on your cardiac medications before your next appointment, please call your pharmacy.   Labwork:  None ordered  Testing/Procedures:  None ordered  Follow-Up:  Your physician recommends that you schedule a follow-up appointment in: 2 months with Dr. Camnitz  Thank you for choosing [facility]!!   Ladavia Lindenbaum, RN [phone]        

## 2018-05-22 NOTE — Progress Notes (Signed)
Electrophysiology TeleHealth Note   Due to national recommendations of social distancing due to COVID 19, an audio/video telehealth visit is felt to be most appropriate for this patient at this time.  Consent was obtained verbally over the phone during this telehealth visit.   Date:  05/22/2018   ID:  Ryan Harper, DOB Sep 02, 1985, MRN [id number]  Location: patient's home  Provider location: [address], [place]  Evaluation Performed: Follow-up visit  PCP:  Ryan Mustard, MD  Cardiologist:  No primary care provider on file.  Electrophysiologist:  Dr Elberta Fortis  Chief Complaint:  Atrial flutter  History of Present Illness:    Ryan Harper is a 33 y.o. male who presents via audio/video conferencing for a telehealth visit today.  Since last being seen in our clinic, the patient reports doing very well.  Today, he denies symptoms of palpitations, chest pain, shortness of breath,  lower extremity edema, dizziness, presyncope, or syncope.  The patient is otherwise without complaint today.  The patient denies symptoms of fevers, chills, cough, or new SOB worrisome for COVID 19.  He has a history of atrial flutter and pericarditis.  He was seen in the hospital March 2020 with a flulike illness, when he developed chest heaviness, palpitations, and sought attention with his primary physician.  He was found to be in atrial flutter and was suspected to have pericarditis.  He was started on NSAIDs and colchicine.  He was admitted to the hospital after feeling palpitations while at work.  He developed short of breath and weakness.  He was found to be in a wide-complex tachycardia with heart rates in the 230s.  He was given an amiodarone bolus and converted to atrial flutter.  Today, denies symptoms of palpitations, chest pain, shortness of breath, orthopnea, PND, lower extremity edema, claudication, dizziness, presyncope, syncope, bleeding, or neurologic sequela. The patient is  tolerating medications without difficulties. He has had two episodes where he had to take PRN dilitazem, but has since done well.   Past Medical History:  Diagnosis Date  . History of recurrent UTIs   . Pericarditis   . Screening for HIV (human immunodeficiency virus)     Past Surgical History:  Procedure Laterality Date  . TYMPANOSTOMY TUBE PLACEMENT    . TYMPANOSTOMY TUBE PLACEMENT  1992    Current Outpatient Medications  Medication Sig Dispense Refill  . acetaminophen (TYLENOL) 500 MG tablet Take 500 mg by mouth every 6 (six) hours as needed.    [name] apixaban (ELIQUIS) 5 MG TABS tablet Take 1 tablet (5 mg total) by mouth 2 (two) times daily. 60 tablet 3  . diltiazem (CARDIZEM CD) 180 MG 24 hr capsule Take 1 capsule (180 mg total) by mouth daily. 30 capsule 3  . diltiazem (CARDIZEM) 30 MG tablet Take 1 tablet (30 mg total) by mouth every 2 (two) hours as needed (for palpitations, do not exceed 3 doses in a day). 90 tablet 1  . Multiple Vitamin (MULTIVITAMIN WITH MINERALS) TABS tablet Take 1 tablet by mouth daily.     No current facility-administered medications for this visit.     Allergies:   Penicillins   Social History:  The patient  reports that he has never smoked. He has never used smokeless tobacco. He reports current alcohol use. He reports that he does not use drugs.   Family History:  The patient's Family history is unknown by patient.   ROS:  Please see the history of present illness.  All other systems are personally reviewed and negative.    Exam:    Vital Signs:  There were no vitals taken for this visit.  Well appearing, alert and conversant, regular work of breathing,  good skin color Eyes- anicteric, neuro- grossly intact, skin- no apparent rash or lesions or cyanosis, mouth- oral mucosa is pink   Labs/Other Tests and Data Reviewed:    Recent Labs: 03/02/2018: TSH 1.19 04/25/2018: ALT 53; BUN 14; Creatinine, Ser 1.50; Hemoglobin 16.0; Platelets 403;  Potassium 4.0; Sodium 140   Wt Readings from Last 3 Encounters:  04/26/18 261 lb (118.4 kg)  03/01/18 243 lb 9.6 oz (110.5 kg)  02/26/18 243 lb 12 oz (110.6 kg)     Other studies personally reviewed: Additional studies/ records that were reviewed today include: TTE 03/02/18  Review of the above records today demonstrates:   - Left ventricle: The cavity size was normal. Wall thickness was   normal. Systolic function was normal. The estimated ejection   fraction was in the range of 55% to 60%. Wall motion was normal;   there were no regional wall motion abnormalities. The study is   not technically sufficient to allow evaluation of LV diastolic   function. - Left atrium: The atrium was mildly dilated. - Right atrium: The atrium was mildly dilated. - Atrial septum: No defect or patent foramen ovale was identified.   ECG 04/26/18 - personally reviewed Atrial flutter, rate 111     ASSESSMENT & PLAN:    1.  Atrial flutter with rapid rates: currently on eliquis. He is doing well currently. Ryan Harper plan for ablation once virus issues have pased  This patients CHA2DS2-VASc Score and unadjusted Ischemic Stroke Rate (% per year) is equal to 0.2 % stroke rate/year from a score of 0  Above score calculated as 1 point each if present [CHF, HTN, DM, Vascular=MI/PAD/Aortic Plaque, Age if 65-74, or Male] Above score calculated as 2 points each if present [Age > 75, or Stroke/TIA/TE]    Patient with difficulty in getting video to work correctly. The visit was adjsuted to a telephone visit.  COVID 19 screen The patient denies symptoms of COVID 19 at this time.  The importance of social distancing was discussed today.  Follow-up:  2 months   Current medicines are reviewed at length with the patient today.   The patient does not have concerns regarding his medicines.  The following changes were made today:  none  Labs/ tests ordered today include:  No orders of the defined types were  placed in this encounter.    Patient Risk:  after full review of this patients clinical status, I feel that they are at moderate risk at this time.  Today, I have spent 15 minutes with the patient with telehealth technology discussing atrial flutter .    Signed, Tanishia Lemaster Jorja LoaMartin Evolette Pendell, MD  05/22/2018 3:27 PM     [facility] [address] [place] [address] [phone] (office) [phone] (fax)

## 2018-05-22 NOTE — Telephone Encounter (Signed)
Virtual Visit Pre-Appointment Phone Call  Steps For Call:  1. Confirm consent - "In the setting of the current Covid19 crisis, you are scheduled for a (phone or video) visit with your provider on (date) at (time).  Just as we do with many in-office visits, in order for you to participate in this visit, we must obtain consent.  If you'd like, I can send this to your mychart (if signed up) or email for you to review.  Otherwise, I can obtain your verbal consent now.  All virtual visits are billed to your insurance company just like a normal visit would be.  By agreeing to a virtual visit, we'd like you to understand that the technology does not allow for your provider to perform an examination, and thus may limit your provider's ability to fully assess your condition.  Finally, though the technology is pretty good, we cannot assure that it will always work on either your or our end, and in the setting of a video visit, we may have to convert it to a phone-only visit.  In either situation, we cannot ensure that we have a secure connection.  Are you willing to proceed?" STAFF: Did the patient verbally acknowledge consent to telehealth visit?  YES  2. Confirm the BEST phone number to call the day of the visit: [phone]  3. Give patient instructions for WebEx/MyChart download to smartphone as below or Doximity/Doxy.me if video visit (depending on what platform provider is using)  4. Advise patient to be prepared with any vital sign or heart rhythm information, their current medicines, and a piece of paper and pen handy for any instructions they may receive the day of their visit  5. Inform patient they will receive a phone call 15 minutes prior to their appointment time (may be from unknown caller ID) so they should be prepared to answer  6. Confirm that appointment type is correct in Epic appointment notes (video vs telephone)     TELEPHONE CALL NOTE  Ryan Harper has been deemed a  candidate for a follow-up tele-health visit to limit community exposure during the Covid-19 pandemic. I spoke with the patient via phone to ensure availability of phone/video source, confirm preferred email & phone number, and discuss instructions and expectations.  I reminded Ryan Harper to be prepared with any vital sign and/or heart rhythm information that could potentially be obtained via home monitoring, at the time of his visit. I reminded Ryan Harper to expect a phone call at the time of his visit if his visit.  Baird Lyons, RN 05/22/2018 3:08 PM   DOWNLOADING THE WEBEX APP TO SMARTPHONE  - If Apple, ask patient to go to App Store and type in WebEx in the search bar. Download [employer], the blue/green circle. If Android, go to [employer] and type in [employer] in the search bar. The app is free but as with any other app downloads, their phone may require them to verify saved payment information or Apple/Android password.  - The patient does NOT have to create an account. - On the day of the visit, the assist will walk the patient through joining the meeting with the meeting number/password.  DOWNLOADING THE MYCHART APP TO SMARTPHONE  - If Apple, go to [employer] and type in MyChart in the search bar and download the app. If Android, ask patient to go to [employer] and type in [place] in the search bar and download the app.  The app is free but as with any other app downloads, their phone may require them to verify saved payment information or Apple/Android password.  - The patient will need to then log into the app with their MyChart username and password, and select Ryan Harper as their healthcare provider to link the account. When it is time for your visit, go to the MyChart app, find appointments, and click Begin Video Visit. Be sure to Select Allow for your device to access the Microphone and Camera for your visit. You will then be connected, and your  provider will be with you shortly.  **If they have any issues connecting, or need assistance please contact MyChart service desk [phone])**  **If using a computer, in order to ensure the best quality for your visit they will need to use either of the following Internet Browsers: Microsoft [place], or Google Chrome**  FULL LENGTH CONSENT FOR TELE-HEALTH VISIT   I hereby voluntarily request, consent and authorize [facility] and its employed or contracted physicians, physician assistants, nurse practitioners or other licensed health care professionals (the Practitioner), to provide me with telemedicine health care services (the Services") as deemed necessary by the treating Practitioner. I acknowledge and consent to receive the Services by the Practitioner via telemedicine. I understand that the telemedicine visit will involve communicating with the Practitioner through live audiovisual communication technology and the disclosure of certain medical information by electronic transmission. I acknowledge that I have been given the opportunity to request an in-person assessment or other available alternative prior to the telemedicine visit and am voluntarily participating in the telemedicine visit.  I understand that I have the right to withhold or withdraw my consent to the use of telemedicine in the course of my care at any time, without affecting my right to future care or treatment, and that the Practitioner or I may terminate the telemedicine visit at any time. I understand that I have the right to inspect all information obtained and/or recorded in the course of the telemedicine visit and may receive copies of available information for a reasonable fee.  I understand that some of the potential risks of receiving the Services via telemedicine include:   Delay or interruption in medical evaluation due to technological equipment failure or disruption;  Information transmitted may not be  sufficient (e.g. poor resolution of images) to allow for appropriate medical decision making by the Practitioner; and/or   In rare instances, security protocols could fail, causing a breach of personal health information.  Furthermore, I acknowledge that it is my responsibility to provide information about my medical history, conditions and care that is complete and accurate to the best of my ability. I acknowledge that Practitioner's advice, recommendations, and/or decision may be based on factors not within their control, such as incomplete or inaccurate data provided by me or distortions of diagnostic images or specimens that may result from electronic transmissions. I understand that the practice of medicine is not an exact science and that Practitioner makes no warranties or guarantees regarding treatment outcomes. I acknowledge that I will receive a copy of this consent concurrently upon execution via email to the email address I last provided but may also request a printed copy by calling the office of [facility].    I understand that my insurance will be billed for this visit.   I have read or had this consent read to me.  I understand the contents of this consent, which adequately explains the benefits and risks  of the Services being provided via telemedicine.   I have been provided ample opportunity to ask questions regarding this consent and the Services and have had my questions answered to my satisfaction.  I give my informed consent for the services to be provided through the use of telemedicine in my medical care  By participating in this telemedicine visit I agree to the above.

## 2018-08-13 ENCOUNTER — Other Ambulatory Visit: Payer: Self-pay | Admitting: *Deleted

## 2018-08-13 MED ORDER — APIXABAN 5 MG PO TABS: 5.0000 mg | ORAL_TABLET | Freq: Two times a day (BID) | ORAL | 1 refills | Status: DC

## 2018-08-13 NOTE — Telephone Encounter (Signed)
Patient left a msg on the refill vm requesting ninety day refills on eliquis. Thanks, MI

## 2018-08-13 NOTE — Telephone Encounter (Signed)
Eliquis 5mg  refill request received; pt is 33 yrs old, wt-110.5kg, Crea-1.50 on 04/25/2018, last seen by Dr. Curt Bears on 05/22/2018; will send in refill to requested pharmacy.

## 2018-08-22 ENCOUNTER — Telehealth: Payer: Self-pay | Admitting: Cardiology

## 2018-08-22 NOTE — Telephone Encounter (Signed)
New Message ° ° ° °Left message to confirm appt and answer covid questions  °

## 2018-08-24 ENCOUNTER — Other Ambulatory Visit: Payer: Self-pay

## 2018-08-24 ENCOUNTER — Encounter: Payer: Self-pay | Admitting: Cardiology

## 2018-08-24 ENCOUNTER — Ambulatory Visit (INDEPENDENT_AMBULATORY_CARE_PROVIDER_SITE_OTHER): Payer: Managed Care, Other (non HMO) | Admitting: Cardiology

## 2018-08-24 VITALS — BP 126/70 | HR 79 | Ht 72.0 in | Wt 245.0 lb

## 2018-08-24 DIAGNOSIS — I4892 Unspecified atrial flutter: Secondary | ICD-10-CM | POA: Diagnosis not present

## 2018-08-24 MED ORDER — DILTIAZEM HCL 30 MG PO TABS: 30.0000 mg | ORAL_TABLET | ORAL | 1 refills | Status: DC | PRN

## 2018-08-24 MED ORDER — APIXABAN 5 MG PO TABS: 5.0000 mg | ORAL_TABLET | Freq: Two times a day (BID) | ORAL | 1 refills | Status: DC

## 2018-08-24 MED ORDER — DILTIAZEM HCL ER COATED BEADS 180 MG PO CP24: 180.0000 mg | ORAL_CAPSULE | Freq: Every day | ORAL | 1 refills | Status: DC

## 2018-08-24 NOTE — Progress Notes (Signed)
Electrophysiology Office Note   Date:  08/24/2018   ID:  Ryan ShoemakerGilberto Veney, DOB 07-12-1985, MRN [id number]  PCP:  Orland MustardWolfe, Allison, MD  Cardiologist:   Primary Electrophysiologist:  Evoleht Hovatter Jorja LoaMartin Rupa Lagan, MD    No chief complaint on file.    History of Present Illness: Ryan Harper is a 33 y.o. male who is being seen today for the evaluation of WCT at the request of Orland MustardWolfe, Allison, MD. Presenting today for electrophysiology evaluation.  He has a history of atrial flutter and pericarditis.  He was seen in the hospital March 2020 with a flulike illness when he developed chest heaviness, palpitations.  He was found to be in atrial flutter.  He was started on NSAIDs and colchicine.  He was admitted to the hospital with palpitations found to have heart rates in the 230s.  He was given an amiodarone bolus and converted to atrial flutter.  Today, he denies symptoms of chest pain, shortness of breath, orthopnea, PND, lower extremity edema, claudication, dizziness, presyncope, syncope, bleeding, or neurologic sequela. The patient is tolerating medications without difficulties.  He continues to have episodic palpitations.  He is also having some chest discomfort when he lays flat.   Past Medical History:  Diagnosis Date  . History of recurrent UTIs   . Pericarditis   . Screening for HIV (human immunodeficiency virus)    Past Surgical History:  Procedure Laterality Date  . TYMPANOSTOMY TUBE PLACEMENT    . TYMPANOSTOMY TUBE PLACEMENT  1992     Current Outpatient Medications  Medication Sig Dispense Refill  . acetaminophen (TYLENOL) 500 MG tablet Take 500 mg by mouth every 6 (six) hours as needed.    [name]. apixaban (ELIQUIS) 5 MG TABS tablet Take 1 tablet (5 mg total) by mouth 2 (two) times daily. 180 tablet 1  . diltiazem (CARDIZEM CD) 180 MG 24 hr capsule Take 1 capsule (180 mg total) by mouth daily. 90 capsule 1  . diltiazem (CARDIZEM) 30 MG tablet Take 1 tablet (30 mg total) by mouth  every 2 (two) hours as needed (for palpitations, do not exceed 3 doses in a day). 30 tablet 1  . Multiple Vitamin (MULTIVITAMIN WITH MINERALS) TABS tablet Take 1 tablet by mouth daily.     No current facility-administered medications for this visit.     Allergies:   Penicillins   Social History:  The patient  reports that he has never smoked. He has never used smokeless tobacco. He reports current alcohol use. He reports that he does not use drugs.   Family History:  The patient's Family history is unknown by patient.    ROS:  Please see the history of present illness.   Otherwise, review of systems is positive for none.   All other systems are reviewed and negative.    PHYSICAL EXAM: VS:  BP 126/70   Pulse 79   Ht 6' (1.829 m)   Wt 245 lb (111.1 kg)   BMI 33.23 kg/m  , BMI Body mass index is 33.23 kg/m. GEN: Well nourished, well developed, in no acute distress  HEENT: normal  Neck: no JVD, carotid bruits, or masses Cardiac: RRR; no murmurs, rubs, or gallops,no edema  Respiratory:  clear to auscultation bilaterally, normal work of breathing GI: soft, nontender, nondistended, + BS MS: no deformity or atrophy  Skin: warm and dry Neuro:  Strength and sensation are intact Psych: euthymic mood, full affect  EKG:  EKG is ordered today. Personal review of the ekg ordered shows  atrial flutter  Recent Labs: 03/02/2018: TSH 1.19 04/25/2018: ALT 53; BUN 14; Creatinine, Ser 1.50; Hemoglobin 16.0; Platelets 403; Potassium 4.0; Sodium 140    Lipid Panel     Component Value Date/Time   CHOL 233 (H) 12/18/2017 0927   TRIG 191.0 (H) 12/18/2017 0927   HDL 48.60 12/18/2017 0927   CHOLHDL 5 12/18/2017 0927   VLDL 38.2 12/18/2017 0927   LDLCALC 147 (H) 12/18/2017 0927     Wt Readings from Last 3 Encounters:  08/24/18 245 lb (111.1 kg)  04/26/18 261 lb (118.4 kg)  03/01/18 243 lb 9.6 oz (110.5 kg)      Other studies Reviewed: Additional studies/ records that were reviewed today  include: TTE 03/02/18  Review of the above records today demonstrates:  - Left ventricle: The cavity size was normal. Wall thickness was   normal. Systolic function was normal. The estimated ejection   fraction was in the range of 55% to 60%. Wall motion was normal;   there were no regional wall motion abnormalities. The study is   not technically sufficient to allow evaluation of LV diastolic   function. - Left atrium: The atrium was mildly dilated. - Right atrium: The atrium was mildly dilated. - Atrial septum: No defect or patent foramen ovale was identified.    ASSESSMENT AND PLAN:  1.  Atrial flutter: Appears to be typical.  We Chase Arnall plan for ablation.  Risks and benefits were discussed and include bleeding, tamponade, heart block, stroke.  The patient understand these risks and is agreed to the procedure.  This patients CHA2DS2-VASc Score and unadjusted Ischemic Stroke Rate (% per year) is equal to 0.2 % stroke rate/year from a score of 0  Above score calculated as 1 point each if present [CHF, HTN, DM, Vascular=MI/PAD/Aortic Plaque, Age if 65-74, or Male] Above score calculated as 2 points each if present [Age > 75, or Stroke/TIA/TE]  2.  Pericarditis: Is continued to have mild chest discomfort.  We Assia Meanor plan for ablation and if chest discomfort continues, Ronrico Dupin potentially need colchicine.  Current medicines are reviewed at length with the patient today.   The patient does not have concerns regarding his medicines.  The following changes were made today:  none  Labs/ tests ordered today include:  Orders Placed This Encounter  Procedures  . EKG 12-Lead     Disposition:   FU with Brae Schaafsma 3 months  Signed, Zaidy Absher Meredith Leeds, MD  08/24/2018 12:16 PM     [address] [place] [address] [phone] (office) [phone] (fax)

## 2018-08-24 NOTE — Patient Instructions (Signed)
Medication Instructions:  Your physician recommends that you continue on your current medications as directed. Please refer to the Current Medication list given to you today.  * If you need a refill on your cardiac medications before your next appointment, please call your pharmacy.   Labwork: None ordered *We will only notify you of abnormal results, otherwise continue current treatment plan.  Testing/Procedures: Your physician has recommended that you have an ablation. Catheter ablation is a medical procedure used to treat some cardiac arrhythmias (irregular heartbeats). During catheter ablation, a long, thin, flexible tube is put into a blood vessel in your groin (upper thigh), or neck. This tube is called an ablation catheter. It is then guided to your heart through the blood vessel. Radio frequency waves destroy small areas of heart tissue where abnormal heartbeats may cause an arrhythmia to start. Please see the instruction sheet given to you today.  The nurse will call to arrange this procedure. Procedure will probably not be until after August.  Instructions for your ablation: 1. Please arrive at the [facility], Main Entrance "A", of [facility] at ______ on _____. 2. Do not eat or drink after midnight the night prior to the procedure. 3. Do not miss any doses of ELIQUIS prior to the morning of the procedure.  4. Do not take any medications the morning of the procedure. 5. Both of your groins will need to be shaved for this procedure (if needed). We ask that you do this yourself at home 1-2 days prior to the procedure.  If you are unable/uncomfortable to do yourself, the hospital staff will shave you the day of your procedure (if needed). 6. You will need someone to drive you home at discharge.   Follow-Up: To be determined once procedure is scheduled.  *Please note that any paperwork needing to be filled out by the provider will need to be addressed at the front desk prior  to seeing the provider. Please note that any FMLA, disability or other documents regarding health condition is subject to a $25.00 charge that must be received prior to completion of paperwork in the form of a money order or check.  Thank you for choosing [facility]!!   Trinidad Curet, RN [phone]  Any Other Special Instructions Will Be Listed Below (If Applicable).  Cardiac Ablation Cardiac ablation is a procedure to disable (ablate) a small amount of heart tissue in very specific places. The heart has many electrical connections. Sometimes these connections are abnormal and can cause the heart to beat very fast or irregularly. Ablating some of the problem areas can improve the heart rhythm or return it to normal. Ablation may be done for people who:  Have Wolff-Parkinson-White syndrome.  Have fast heart rhythms (tachycardia).  Have taken medicines for an abnormal heart rhythm (arrhythmia) that were not effective or caused side effects.  Have a high-risk heartbeat that may be life-threatening. During the procedure, a small incision is made in the neck or the groin, and a long, thin, flexible tube (catheter) is inserted into the incision and moved to the heart. Small devices (electrodes) on the tip of the catheter will send out electrical currents. A type of X-ray (fluoroscopy) will be used to help guide the catheter and to provide images of the heart. Tell a health care provider about:  Any allergies you have.  All medicines you are taking, including vitamins, herbs, eye drops, creams, and over-the-counter medicines.  Any problems you or family members have had  with anesthetic medicines.  Any blood disorders you have.  Any surgeries you have had.  Any medical conditions you have, such as kidney failure.  Whether you are pregnant or may be pregnant. What are the risks? Generally, this is a safe procedure. However, problems may occur, including:  Infection.  Bruising  and bleeding at the catheter insertion site.  Bleeding into the chest, especially into the sac that surrounds the heart. This is a serious complication.  Stroke or blood clots.  Damage to other structures or organs.  Allergic reaction to medicines or dyes.  Need for a permanent pacemaker if the normal electrical system is damaged. A pacemaker is a small computer that sends electrical signals to the heart and helps your heart beat normally.  The procedure not being fully effective. This may not be recognized until months later. Repeat ablation procedures are sometimes required. What happens before the procedure?  Follow instructions from your health care provider about eating or drinking restrictions.  Ask your health care provider about: ? Changing or stopping your regular medicines. This is especially important if you are taking diabetes medicines or blood thinners. ? Taking medicines such as aspirin and ibuprofen. These medicines can thin your blood. Do not take these medicines before your procedure if your health care provider instructs you not to.  Plan to have someone take you home from the hospital or clinic.  If you will be going home right after the procedure, plan to have someone with you for 24 hours. What happens during the procedure?  To lower your risk of infection: ? Your health care team will wash or sanitize their hands. ? Your skin will be washed with soap. ? Hair may be removed from the incision area.  An IV tube will be inserted into one of your veins.  You will be given a medicine to help you relax (sedative).  The skin on your neck or groin will be numbed.  An incision will be made in your neck or your groin.  A needle will be inserted through the incision and into a large vein in your neck or groin.  A catheter will be inserted into the needle and moved to your heart.  Dye may be injected through the catheter to help your surgeon see the area of the  heart that needs treatment.  Electrical currents will be sent from the catheter to ablate heart tissue in desired areas. There are three types of energy that may be used to ablate heart tissue: ? Heat (radiofrequency energy). ? Laser energy. ? Extreme cold (cryoablation).  When the necessary tissue has been ablated, the catheter will be removed.  Pressure will be held on the catheter insertion area to prevent excessive bleeding.  A bandage (dressing) will be placed over the catheter insertion area. The procedure may vary among health care providers and hospitals. What happens after the procedure?  Your blood pressure, heart rate, breathing rate, and blood oxygen level will be monitored until the medicines you were given have worn off.  Your catheter insertion area will be monitored for bleeding. You will need to lie still for a few hours to ensure that you do not bleed from the catheter insertion area.  Do not drive for 24 hours or as long as directed by your health care provider. Summary  Cardiac ablation is a procedure to disable (ablate) a small amount of heart tissue in very specific places. Ablating some of the problem areas can improve the heart  rhythm or return it to normal.  During the procedure, electrical currents will be sent from the catheter to ablate heart tissue in desired areas. This information is not intended to replace advice given to you by your health care provider. Make sure you discuss any questions you have with your health care provider. Document Released: 06/12/2008 Document Revised: 07/17/2017 Document Reviewed: 12/14/2015 Elsevier Patient Education  2020 [employer]

## 2018-09-25 ENCOUNTER — Telehealth: Payer: Self-pay | Admitting: *Deleted

## 2018-09-25 NOTE — Telephone Encounter (Signed)
Pt returns my call.  Informs me that he cannot go for AFL ablation next week as he starts training at a new job.   Pt will call me back when he can reschedule procedure.

## 2018-10-04 ENCOUNTER — Ambulatory Visit (HOSPITAL_COMMUNITY)
Admission: RE | Admit: 2018-10-04 | Payer: Managed Care, Other (non HMO) | Source: Home / Self Care | Admitting: Cardiology

## 2018-10-04 ENCOUNTER — Encounter (HOSPITAL_COMMUNITY): Admission: RE | Payer: Self-pay | Source: Home / Self Care

## 2018-10-04 SURGERY — A-FLUTTER ABLATION
Anesthesia: General

## 2018-10-08 ENCOUNTER — Encounter: Payer: Self-pay | Admitting: Family Medicine

## 2018-10-08 ENCOUNTER — Other Ambulatory Visit: Payer: Self-pay | Admitting: *Deleted

## 2018-10-08 ENCOUNTER — Ambulatory Visit (INDEPENDENT_AMBULATORY_CARE_PROVIDER_SITE_OTHER): Payer: Managed Care, Other (non HMO) | Admitting: Family Medicine

## 2018-10-08 VITALS — Wt 245.0 lb

## 2018-10-08 DIAGNOSIS — R509 Fever, unspecified: Secondary | ICD-10-CM | POA: Diagnosis not present

## 2018-10-08 DIAGNOSIS — R52 Pain, unspecified: Secondary | ICD-10-CM

## 2018-10-08 DIAGNOSIS — Z20822 Contact with and (suspected) exposure to covid-19: Secondary | ICD-10-CM

## 2018-10-08 NOTE — Progress Notes (Signed)
Patient: Ryan Harper MRN: [id number] DOB: 07/27/1985 PCP: Orma Flaming, MD     I connected with Ann Maki on 10/08/18 at 1:54pm by a video enabled telemedicine application and verified that I am speaking with the correct person using two identifiers.  Location patient: Home Location provider: [facility], Office Persons participating in this virtual visit: Dr. Rogers Blocker and Ann Maki   I discussed the limitations of evaluation and management by telemedicine and the availability of in person appointments. The patient expressed understanding and agreed to proceed.   Interactive audio and video telecommunications were attempted between this provider and patient, however failed, due to patient having technical difficulties OR patient did not have access to video capability.  We continued and completed visit with audio only.    Subjective:  Chief Complaint  Patient presents with  . Headache  . Generalized Body Aches  . Chills    HPI: The patient is a 33 y.o. male who presents today for flu like symptoms. He was tested for COVID at a [facility] drive through on 6/43/3295. He is still waiting on test results. He started to feel bad last Tuesday, august 25th with body aches, fever, cold sweats and chills. On Wednesday morning he really felt bad. He states it feels the exact same as when he had the flu. He is feeling better, but still not back to 100%. Yesterday his temperature was 99.7. He has a mild cough, but no shortness of breath. He had one episode of vomiting last week, but none since that time. NO diarrhea. 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 No other sick contacts. He has been staying home. He has taken ibuprofen and nyquil. This has only helped with his sleep.   Review of Systems  Constitutional: Positive for chills, fatigue and fever.  HENT: Positive for congestion, rhinorrhea and sore throat. Negative for ear pain, sinus pressure,  sinus pain and trouble swallowing.   Eyes: Positive for photophobia. Negative for pain.  Respiratory: Positive for cough. Negative for chest tightness and shortness of breath.   Cardiovascular: Negative for chest pain, palpitations and leg swelling.  Gastrointestinal: Negative for abdominal pain, diarrhea, nausea and vomiting.  Musculoskeletal: Positive for arthralgias, back pain and myalgias. Negative for neck pain.  Skin: Negative for rash.  Neurological: Positive for dizziness and headaches.  Psychiatric/Behavioral: Positive for sleep disturbance.    Allergies Patient is allergic to penicillins.  Past Medical History Patient  has a past medical history of History of recurrent UTIs, Pericarditis, and Screening for HIV (human immunodeficiency virus).  Surgical History Patient  has a past surgical history that includes Tympanostomy tube placement and Tympanostomy tube placement (1992).  Family History Pateint's Family history is unknown by patient.  Social History Patient  reports that he has never smoked. He has never used smokeless tobacco. He reports current alcohol use. He reports that he does not use drugs.    Objective: Vitals:   10/08/18 1330  Weight: 245 lb (111.1 kg)    Body mass index is 33.23 kg/m.      Assessment/plan: 1. Fever and chills Flu vs. Covid. Test ordered as I think we can get it back faster. [place] go today to [place]. Discussed needs to isolate at home. Will be 10 days on Thursday since symptom on set. Symptoms are improving. Will have him go back to work on 10/16/2018. Precautions given.  - Novel Coronavirus, NAA ([facility])  2. Body aches  See above.  - Novel Coronavirus, NAA ([facility])   Return if symptoms worsen or fail to improve.   Orland MustardAllison Maurio Baize, MD [facility]  10/08/2018

## 2018-10-09 LAB — NOVEL CORONAVIRUS, NAA: SARS-CoV-2, NAA: DETECTED — AB

## 2018-10-23 ENCOUNTER — Ambulatory Visit: Payer: Self-pay

## 2018-10-23 NOTE — Telephone Encounter (Signed)
Pt. Reports he still has a "nagging headache from when I had my COVID 19 diagnosis." Reports some lightheadedness first thing in the morning "and then it goes away." "Headache starts in the morning and progresses throughout the day." Takes Mortin for the headache. Has not returned to work yet. Warm transfer to [facility] in the practice for a visit. Answer Assessment - Initial Assessment Questions 1. LOCATION: "Where does it hurt?"      Between eyes and sometimes around eyes 2. ONSET: "When did the headache start?" (Minutes, hours or days)      End of August 3. PATTERN: "Does the pain come and go, or has it been constant since it started?"     Starts in the morning and progresses 4. SEVERITY: "How bad is the pain?" and "What does it keep you from doing?"  (e.g., Scale 1-10; mild, moderate, or severe)   - MILD (1-3): doesn't interfere with normal activities    - MODERATE (4-7): interferes with normal activities or awakens from sleep    - SEVERE (8-10): excruciating pain, unable to do any normal activities        8 5. RECURRENT SYMPTOM: "Have you ever had headaches before?" If so, ask: "When was the last time?" and "What happened that time?"      Since August 6. CAUSE: "What do you think is causing the headache?"     COVID 19 7. MIGRAINE: "Have you been diagnosed with migraine headaches?" If so, ask: "Is this headache similar?"      No 8. HEAD INJURY: "Has there been any recent injury to the head?"      No 9. OTHER SYMPTOMS: "Do you have any other symptoms?" (fever, stiff neck, eye pain, sore throat, cold symptoms)     Eye pain 10. PREGNANCY: "Is there any chance you are pregnant?" "When was your last menstrual period?"       n/a  Protocols used: HEADACHE-A-AH

## 2018-10-25 ENCOUNTER — Encounter: Payer: Self-pay | Admitting: Family Medicine

## 2018-10-25 ENCOUNTER — Ambulatory Visit (INDEPENDENT_AMBULATORY_CARE_PROVIDER_SITE_OTHER): Payer: Managed Care, Other (non HMO) | Admitting: Family Medicine

## 2018-10-25 VITALS — Ht 72.0 in | Wt 233.0 lb

## 2018-10-25 DIAGNOSIS — B349 Viral infection, unspecified: Secondary | ICD-10-CM | POA: Diagnosis not present

## 2018-10-25 DIAGNOSIS — R0981 Nasal congestion: Secondary | ICD-10-CM | POA: Diagnosis not present

## 2018-10-25 DIAGNOSIS — R51 Headache: Secondary | ICD-10-CM

## 2018-10-25 MED ORDER — IBUPROFEN 800 MG PO TABS: 800.0000 mg | ORAL_TABLET | Freq: Three times a day (TID) | ORAL | 1 refills | Status: AC | PRN

## 2018-10-25 MED ORDER — FLUTICASONE PROPIONATE 50 MCG/ACT NA SUSP: 2.0000 | Freq: Every day | NASAL | 6 refills | Status: DC

## 2018-10-25 NOTE — Progress Notes (Signed)
Patient: Ryan Harper MRN: [id number] DOB: May 04, 1985 PCP: Orland MustardWolfe, Anitra Doxtater, MD     I connected with Ryan Harper on 10/25/18 at 11:12am by a video enabled telemedicine application and verified that I am speaking with the correct person using two identifiers.  Location patient: Home Location provider: [facility], Office Persons participating in this virtual visit: Ryan Harper and DR. Artis FlockWolfe   I discussed the limitations of evaluation and management by telemedicine and the availability of in person appointments. The patient expressed understanding and agreed to proceed.   Interactive audio and video telecommunications were attempted between this provider and patient, however failed, due to patient having technical difficulties OR patient did not have access to video capability.  We continued and completed visit with audio only.   Subjective:  Chief Complaint  Patient presents with  . Headache  . Nasal Congestion    HPI: The patient is a 33 y.o. male who presents today for headache and nasal congestion. He was diagnosed with covid on 10/08/2018. He states he has had a headache for 2 weeks straight. Sometimes the pain is so bad he wants to throw up. He feels like behind his eye sockets are throbbing. The headaches started after covid. He has been taking 800mg  of ibuprofen, but ran out of this. This helped a lot. He will wake up without a headache, but then it comes on later on in day. headache today rated as a 6-7/10 and is between his eyes to the top of his head. Headaches described as sharp and throbbing. They can last a few hours or until he takes something and goes to sleep. They do not wake him up in the middle of the night, no focal deficits, no thunderclap headache. He has no photophobia or vomiting. No vision changes.  No hx of headaches or migraines. Has tenderness to the touch on his head.  He is not drinking and is not stressed.   Review of Systems  Constitutional:  Negative for fatigue and fever.  HENT: Positive for rhinorrhea. Negative for congestion, postnasal drip and sore throat.   Eyes: Positive for pain. Negative for photophobia and visual disturbance.  Respiratory: Negative for cough and shortness of breath.   Cardiovascular: Negative for chest pain.  Gastrointestinal: Negative for abdominal pain, diarrhea, nausea and vomiting.  Musculoskeletal: Negative for neck pain.  Neurological: Positive for light-headedness and headaches. Negative for dizziness, facial asymmetry and weakness.  Psychiatric/Behavioral: Positive for sleep disturbance.    Allergies Patient is allergic to penicillins.  Past Medical History Patient  has a past medical history of History of recurrent UTIs, Pericarditis, and Screening for HIV (human immunodeficiency virus).  Surgical History Patient  has a past surgical history that includes Tympanostomy tube placement and Tympanostomy tube placement (1992).  Family History Pateint's Family history is unknown by patient.  Social History Patient  reports that he has never smoked. He has never used smokeless tobacco. He reports current alcohol use. He reports that he does not use drugs.    Objective: Vitals:   10/25/18 1031  Weight: 233 lb (105.7 kg)  Height: 6' (1.829 m)    Body mass index is 31.6 kg/m.  Physical Exam     Assessment/plan: 1. Headache due to viral infection likely sequela of covid infection. Discussed this is kind of uncharted territory and we don't know lasting effects of covid. Some people still have headaches 6 months out. Other thought is tension headache. Will continue ibuprofen 800mg  TID prn since this helps a lot  and rest. If not better or worsening symptoms in 4-6 weeks could consider imaging. Precautions given for ER: thunderclap headache, focal deficits or waking him in the middle of the night. nsaids with food, caution him on excessive use can lead to ulcers.   2. Nasal  congestion flonase daily and could try a zyrtec. Let me know if not improving.    Return if symptoms worsen or fail to improve.     Orpah Greek, MD [place]  10/25/2018

## 2018-12-17 NOTE — Telephone Encounter (Signed)
Follow up   Patient is returning call to see if ablation can still be scheduled before the end of year. Please call to discuss.

## 2018-12-17 NOTE — Telephone Encounter (Signed)
Pt agreeable to 12/22 for ablation. Aware that I will call in next several week to go over instructions. Patient verbalized understanding and agreeable to plan.

## 2018-12-19 ENCOUNTER — Telehealth: Payer: Self-pay | Admitting: Family Medicine

## 2018-12-19 NOTE — Telephone Encounter (Signed)
Patient was last seen 10/25/2018 for head aches and states ibuprofen (ADVIL) 800 MG tablet is not working and would like PCP to refer him to a specialist, please advise

## 2018-12-19 NOTE — Telephone Encounter (Signed)
See below

## 2018-12-20 ENCOUNTER — Other Ambulatory Visit: Payer: Self-pay | Admitting: Family Medicine

## 2018-12-20 DIAGNOSIS — G44201 Tension-type headache, unspecified, intractable: Secondary | ICD-10-CM

## 2018-12-20 NOTE — Telephone Encounter (Signed)
Please see msg below and advise.  Thanks 

## 2018-12-20 NOTE — Telephone Encounter (Signed)
Please let him know I put in referral to neurology Orma Flaming, MD [place]

## 2018-12-20 NOTE — Telephone Encounter (Signed)
Patient informed that referral to neurology has been placed and that they will contact him to schedule appt.  He also stated that he was wondering if his headaches could possibly be being caused by something to do with his vision.  He reports not having had an eye exam "in years" Gave patient the contact information to Dr. Macarthur Critchley and advised to make appointment for eye exam.  He verbalized understanding.

## 2018-12-21 ENCOUNTER — Encounter: Payer: Self-pay | Admitting: Neurology

## 2018-12-28 ENCOUNTER — Telehealth: Payer: Self-pay | Admitting: Cardiology

## 2018-12-28 NOTE — Telephone Encounter (Signed)
I spoke with pt. He is aware procedure is being planned for 12/22 and he does not need COVID testing until closer to procedure He reports increasing palpitations. Taking prn medication more often. Has headaches and feels light headed at times.  He is asking if Dr Curt Bears will write letter taking him out of work until after he has ablation.

## 2018-12-28 NOTE — Telephone Encounter (Signed)
New Message:  The patient said he is scheduled for a procedure on 11/22 and would need to have a COVID test done either today or tomorrow. He wanted to confirm when that test would need to be done. He also wanted to know if the office would write a work release note because he would have to self isolate between the COVID test and the procedure

## 2019-01-01 ENCOUNTER — Telehealth: Payer: Self-pay | Admitting: Cardiology

## 2019-01-01 MED ORDER — DILTIAZEM HCL ER COATED BEADS 300 MG PO CP24: 300.0000 mg | ORAL_CAPSULE | Freq: Every day | ORAL | 1 refills | Status: DC

## 2019-01-01 NOTE — Telephone Encounter (Signed)
Pt advised to increase Diltiazem to 300 mg daily. Advised to call office if issues begin after medication increase. Letter written for pt d/t inability to work this week d/t elevated HRs/dizziness/weakness. Pt aware that we will not put him out of work until his procedure near the end of next month. Patient verbalized understanding and agreeable to plan.

## 2019-01-01 NOTE — Telephone Encounter (Signed)
° ° ° °  Patient requesting letter for employer to stay out of work from 11/20--> after procedure. Patient report palpitations are getting more frequent.  Please call

## 2019-01-14 NOTE — Progress Notes (Deleted)
Virtual Visit via Video Note The purpose of this virtual visit is to provide medical care while limiting exposure to the novel coronavirus.    Consent was obtained for video visit:  Yes.   Answered questions that patient had about telehealth interaction:  Yes.   I discussed the limitations, risks, security and privacy concerns of performing an evaluation and management service by telemedicine. I also discussed with the patient that there may be a patient responsible charge related to this service. The patient expressed understanding and agreed to proceed.  Pt location: Home Physician Location: office Name of referring provider:  Orland Mustard, MD I connected with Ryan Harper at patients initiation/request on 01/16/2019 at  9:10 AM EST by video enabled telemedicine application and verified that I am speaking with the correct person using two identifiers. Pt MRN:  [id number] Pt DOB:  05-15-1985 Video Participants:  Ryan Harper   History of Present Illness:  Joven Mom is a 33 year old male who presents for headache status post COVID infection.  History supplemented by referring provider note.  ***  Current NSAIDs:  Ibuprofen Current analgesics:  Tylenol Current antihypertensive:  Cardizem Current antihistamine/decongestant:  Flonase Current vitamins/supplements:  MVI  Lab: 10/08/2018 SARS-CoV-2 positive   Past Medical History: Past Medical History:  Diagnosis Date  . History of recurrent UTIs   . Pericarditis   . Screening for HIV (human immunodeficiency virus)     Medications: Outpatient Encounter Medications as of 01/16/2019  Medication Sig  . acetaminophen (TYLENOL) 500 MG tablet Take 500 mg by mouth every 6 (six) hours as needed.  [name] apixaban (ELIQUIS) 5 MG TABS tablet Take 1 tablet (5 mg total) by mouth 2 (two) times daily.  [name] diltiazem (CARDIZEM CD) 300 MG 24 hr capsule Take 1 capsule (300 mg total) by mouth daily.  [name] diltiazem (CARDIZEM) 30 MG tablet  Take 1 tablet (30 mg total) by mouth every 2 (two) hours as needed (for palpitations, do not exceed 3 doses in a day). (Patient not taking: Reported on 10/25/2018)  . fluticasone (FLONASE) 50 MCG/ACT nasal spray Place 2 sprays into both nostrils daily.  [name] ibuprofen (ADVIL) 800 MG tablet Take 1 tablet (800 mg total) by mouth every 8 (eight) hours as needed.  . Multiple Vitamin (MULTIVITAMIN WITH MINERALS) TABS tablet Take 1 tablet by mouth daily.   No facility-administered encounter medications on file as of 01/16/2019.     Allergies: Allergies  Allergen Reactions  . Penicillins Other (See Comments)    Did it involve swelling of the face/tongue/throat, SOB, or low BP? Unknown Did it involve sudden or severe rash/hives, skin peeling, or any reaction on the inside of your mouth or nose? Unknown Did you need to seek medical attention at a hospital or doctor's office? Unknown When did it last happen?always told not to take If all above answers are "NO", may proceed with cephalosporin use.     Family History: Family History  Family history unknown: Yes    Social History: Social History   Socioeconomic History  . Marital status: Single    Spouse name: Not on file  . Number of children: Not on file  . Years of education: Not on file  . Highest education level: Not on file  Occupational History  . Not on file  Social Needs  . Financial resource strain: Not on file  . Food insecurity    Worry: Not on file    Inability: Not on file  . Transportation needs  Medical: Not on file    Non-medical: Not on file  Tobacco Use  . Smoking status: Never Smoker  . Smokeless tobacco: Never Used  Substance and Sexual Activity  . Alcohol use: Yes    Comment: social  . Drug use: No  . Sexual activity: Yes  Lifestyle  . Physical activity    Days per week: Not on file    Minutes per session: Not on file  . Stress: Not on file  Relationships  . Social Herbalist on phone:  Not on file    Gets together: Not on file    Attends religious service: Not on file    Active member of club or organization: Not on file    Attends meetings of clubs or organizations: Not on file    Relationship status: Not on file  . Intimate partner violence    Fear of current or ex partner: Not on file    Emotionally abused: Not on file    Physically abused: Not on file    Forced sexual activity: Not on file  Other Topics Concern  . Not on file  Social History Narrative  . Not on file    Observations/Objective:   *** No acute distress.  Alert and oriented.  Speech fluent and not dysarthric.  Language intact.  Eyes orthophoric on primary gaze.  Face symmetric.  Assessment and Plan:   ***  Follow Up Instructions:    -I discussed the assessment and treatment plan with the patient. The patient was provided an opportunity to ask questions and all were answered. The patient agreed with the plan and demonstrated an understanding of the instructions.   The patient was advised to call back or seek an in-person evaluation if the symptoms worsen or if the condition fails to improve as anticipated.    Total Time spent in visit with the patient was:  ***, of which more than 50% of the time was spent in counseling and/or coordinating care on ***.   Pt understands and agrees with the plan of care outlined.     Dudley Major, DO

## 2019-01-16 ENCOUNTER — Ambulatory Visit: Payer: Managed Care, Other (non HMO) | Admitting: Neurology

## 2019-01-21 ENCOUNTER — Telehealth: Payer: Self-pay | Admitting: *Deleted

## 2019-01-21 NOTE — Telephone Encounter (Signed)
Reviewed procedure instructions via mychart, also sent via Mychart. covid screening scheduled for 12/18. Aware office will call to arrange post procedure follow up. Patient verbalized understanding and agreeable to plan.

## 2019-01-22 ENCOUNTER — Telehealth: Payer: Managed Care, Other (non HMO) | Admitting: Cardiology

## 2019-01-22 ENCOUNTER — Telehealth: Payer: Self-pay | Admitting: Cardiology

## 2019-01-22 ENCOUNTER — Other Ambulatory Visit: Payer: Self-pay

## 2019-01-22 NOTE — Telephone Encounter (Signed)
New Message    Pt is calling to speak with Ryan Harper  He says he is wondering if he can do his virtual appt on Thursday     Please call

## 2019-01-22 NOTE — Telephone Encounter (Signed)
Pt has to work tonight until 6 unexpectedly. Rescheduled to Thursday for virtual visit. Pt appreciates our help with this.

## 2019-01-24 ENCOUNTER — Telehealth (INDEPENDENT_AMBULATORY_CARE_PROVIDER_SITE_OTHER): Payer: Managed Care, Other (non HMO) | Admitting: Cardiology

## 2019-01-24 ENCOUNTER — Other Ambulatory Visit: Payer: Self-pay

## 2019-01-24 DIAGNOSIS — I483 Typical atrial flutter: Secondary | ICD-10-CM

## 2019-01-24 DIAGNOSIS — Z7901 Long term (current) use of anticoagulants: Secondary | ICD-10-CM

## 2019-01-24 DIAGNOSIS — Z79899 Other long term (current) drug therapy: Secondary | ICD-10-CM

## 2019-01-24 NOTE — Progress Notes (Signed)
Electrophysiology TeleHealth Note   Due to national recommendations of social distancing due to COVID 19, an audio/video telehealth visit is felt to be most appropriate for this patient at this time.  See Epic message for the patient's consent to telehealth for [facility]   Date:  01/24/2019   ID:  Ryan Harper, DOB 11/20/1985, MRN [id number]  Location: patient's home  Provider location: [address], [place]  Evaluation Performed: Follow-up visit  PCP:  Orma Flaming, MD  Cardiologist:  Anastacia Reinecke Meredith Leeds, MD  Electrophysiologist:  Dr Curt Bears  Chief Complaint: Atrial flutter  History of Present Illness:    Ryan Harper is a 33 y.o. male who presents via audio/video conferencing for a telehealth visit today.  Since last being seen in our clinic, the patient reports doing very well.  Today, he denies symptoms of palpitations, chest pain, shortness of breath,  lower extremity edema, dizziness, presyncope, or syncope.  The patient is otherwise without complaint today.  The patient denies symptoms of fevers, chills, cough, or new SOB worrisome for COVID 19.  He has a history significant for atrial flutter and pericarditis. He has a planned atrial flutter ablation 01/29/2019.  Today, denies symptoms of palpitations, chest pain, shortness of breath, orthopnea, PND, lower extremity edema, claudication, dizziness, presyncope, syncope, bleeding, or neurologic sequela. The patient is tolerating medications without difficulties.    Past Medical History:  Diagnosis Date  . History of recurrent UTIs   . Pericarditis   . Screening for HIV (human immunodeficiency virus)     Past Surgical History:  Procedure Laterality Date  . TYMPANOSTOMY TUBE PLACEMENT    . TYMPANOSTOMY TUBE PLACEMENT  1992    Current Outpatient Medications  Medication Sig Dispense Refill  . acetaminophen (TYLENOL) 500 MG tablet Take 500 mg by mouth every 6 (six) hours as needed (pain).      [name] apixaban (ELIQUIS) 5 MG TABS tablet Take 1 tablet (5 mg total) by mouth 2 (two) times daily. 180 tablet 1  . diltiazem (CARDIZEM CD) 300 MG 24 hr capsule Take 1 capsule (300 mg total) by mouth daily. 30 capsule 1  . diltiazem (CARDIZEM) 30 MG tablet Take 1 tablet (30 mg total) by mouth every 2 (two) hours as needed (for palpitations, do not exceed 3 doses in a day). 30 tablet 1  . fluticasone (FLONASE) 50 MCG/ACT nasal spray Place 2 sprays into both nostrils daily. (Patient not taking: Reported on 01/21/2019) 16 g 6  . ibuprofen (ADVIL) 800 MG tablet Take 1 tablet (800 mg total) by mouth every 8 (eight) hours as needed. 60 tablet 1  . Multiple Vitamin (MULTIVITAMIN WITH MINERALS) TABS tablet Take 1 tablet by mouth daily.     No current facility-administered medications for this visit.    Allergies:   Penicillins   Social History:  The patient  reports that he has never smoked. He has never used smokeless tobacco. He reports current alcohol use. He reports that he does not use drugs.   Family History:  The patient's  Family history is unknown by patient.   ROS:  Please see the history of present illness.   All other systems are personally reviewed and negative.    Exam:    Vital Signs:  There were no vitals taken for this visit.  no acute distress, no shortness of breath  Labs/Other Tests and Data Reviewed:    Recent Labs: 03/02/2018: TSH 1.19 04/25/2018: ALT 53; BUN 14; Creatinine, Ser 1.50;  Hemoglobin 16.0; Platelets 403; Potassium 4.0; Sodium 140   Wt Readings from Last 3 Encounters:  10/25/18 233 lb (105.7 kg)  10/08/18 245 lb (111.1 kg)  08/24/18 245 lb (111.1 kg)     Other studies personally reviewed: Additional studies/ records that were reviewed today include: CG 08/24/2018 personally reviewed Review of the above records today demonstrates: Atrial flutter, rate 75   ASSESSMENT & PLAN:    1.  Typical appearing atrial flutter: Currently on Eliquis and diltiazem.   We Breah Joa plan for ablation 01/29/2019.  Risks and benefits were discussed include bleeding, tamponade, heart block, stroke.  The patient understands these risks and has agreed to the procedure.   COVID 19 screen The patient denies symptoms of COVID 19 at this time.  The importance of social distancing was discussed today.  Follow-up: 1 month  Current medicines are reviewed at length with the patient today.   The patient does not have concerns regarding his medicines.  The following changes were made today:  none  Labs/ tests ordered today include:  No orders of the defined types were placed in this encounter.    Patient Risk:  after full review of this patients clinical status, I feel that they are at moderate risk at this time.  Today, I have spent 7 minutes with the patient with telehealth technology discussing atrial flutter.    Signed, Joleene Burnham Jorja Loa, MD  01/24/2019 3:45 PM     [facility] [address] [place] [address] [phone] (office) [phone] (fax)

## 2019-01-25 ENCOUNTER — Other Ambulatory Visit (HOSPITAL_COMMUNITY)
Admission: RE | Admit: 2019-01-25 | Discharge: 2019-01-25 | Disposition: A | Discharge status: Home / Self Care | Payer: Managed Care, Other (non HMO) | Source: Ambulatory Visit | Attending: Cardiology | Admitting: Cardiology

## 2019-01-25 DIAGNOSIS — Z20828 Contact with and (suspected) exposure to other viral communicable diseases: Secondary | ICD-10-CM | POA: Insufficient documentation

## 2019-01-25 DIAGNOSIS — Z01812 Encounter for preprocedural laboratory examination: Secondary | ICD-10-CM | POA: Diagnosis present

## 2019-01-26 LAB — NOVEL CORONAVIRUS, NAA (HOSP ORDER, SEND-OUT TO REF LAB; TAT 18-24 HRS): SARS-CoV-2, NAA: NOT DETECTED

## 2019-01-29 ENCOUNTER — Encounter (HOSPITAL_COMMUNITY)
Admission: RE | Disposition: A | Discharge status: Home / Self Care | Payer: Self-pay | Source: Home / Self Care | Attending: Cardiology

## 2019-01-29 ENCOUNTER — Ambulatory Visit (HOSPITAL_COMMUNITY): Payer: Managed Care, Other (non HMO) | Admitting: Certified Registered"

## 2019-01-29 ENCOUNTER — Ambulatory Visit (HOSPITAL_COMMUNITY)
Admission: RE | Admit: 2019-01-29 | Discharge: 2019-01-29 | Disposition: A | Discharge status: Home / Self Care | Payer: Managed Care, Other (non HMO) | Attending: Cardiology | Admitting: Cardiology

## 2019-01-29 DIAGNOSIS — Z7901 Long term (current) use of anticoagulants: Secondary | ICD-10-CM | POA: Insufficient documentation

## 2019-01-29 DIAGNOSIS — Z880 Allergy status to penicillin: Secondary | ICD-10-CM | POA: Insufficient documentation

## 2019-01-29 DIAGNOSIS — I483 Typical atrial flutter: Secondary | ICD-10-CM | POA: Diagnosis present

## 2019-01-29 DIAGNOSIS — I4892 Unspecified atrial flutter: Secondary | ICD-10-CM

## 2019-01-29 DIAGNOSIS — Z79899 Other long term (current) drug therapy: Secondary | ICD-10-CM | POA: Diagnosis not present

## 2019-01-29 HISTORY — PX: A-FLUTTER ABLATION: EP1230

## 2019-01-29 LAB — CBC
HCT: 47.6 % (ref 39.0–52.0)
Hemoglobin: 16.6 g/dL (ref 13.0–17.0)
MCH: 31.3 pg (ref 26.0–34.0)
MCHC: 34.9 g/dL (ref 30.0–36.0)
MCV: 89.6 fL (ref 80.0–100.0)
Platelets: 337 10*3/uL (ref 150–400)
RBC: 5.31 MIL/uL (ref 4.22–5.81)
RDW: 12.4 % (ref 11.5–15.5)
WBC: 7 10*3/uL (ref 4.0–10.5)
nRBC: 0 % (ref 0.0–0.2)

## 2019-01-29 LAB — BASIC METABOLIC PANEL
Anion gap: 13 (ref 5–15)
BUN: 15 mg/dL (ref 6–20)
CO2: 25 mmol/L (ref 22–32)
Calcium: 9.6 mg/dL (ref 8.9–10.3)
Chloride: 102 mmol/L (ref 98–111)
Creatinine, Ser: 0.97 mg/dL (ref 0.61–1.24)
GFR calc Af Amer: >60 mL/min (ref >60)
GFR calc non Af Amer: >60 mL/min (ref >60)
Glucose, Bld: 109 mg/dL — ABNORMAL HIGH (ref 70–99)
Potassium: 3.9 mmol/L (ref 3.5–5.1)
Sodium: 140 mmol/L (ref 135–145)

## 2019-01-29 SURGERY — A-FLUTTER ABLATION
Anesthesia: General

## 2019-01-29 MED ORDER — PHENYLEPHRINE HCL-NACL 10-0.9 MG/250ML-% IV SOLN
INTRAVENOUS | Status: DC | PRN
  Administered 2019-01-29: 30 ug/min via INTRAVENOUS

## 2019-01-29 MED ORDER — MIDAZOLAM HCL 5 MG/5ML IJ SOLN
INTRAMUSCULAR | Status: DC | PRN
  Administered 2019-01-29: 2 mg via INTRAVENOUS

## 2019-01-29 MED ORDER — ONDANSETRON HCL 4 MG/2ML IJ SOLN: 4.0000 mg | Freq: Four times a day (QID) | INTRAMUSCULAR | Status: DC | PRN

## 2019-01-29 MED ORDER — SUGAMMADEX SODIUM 200 MG/2ML IV SOLN
INTRAVENOUS | Status: DC | PRN
  Administered 2019-01-29: 230 mg via INTRAVENOUS

## 2019-01-29 MED ORDER — PROPOFOL 10 MG/ML IV BOLUS
INTRAVENOUS | Status: DC | PRN
  Administered 2019-01-29: 200 mg via INTRAVENOUS

## 2019-01-29 MED ORDER — BUPIVACAINE HCL (PF) 0.25 % IJ SOLN
INTRAMUSCULAR | Status: DC | PRN
  Administered 2019-01-29: 30 mL

## 2019-01-29 MED ORDER — ACETAMINOPHEN 325 MG PO TABS: 650.0000 mg | ORAL_TABLET | ORAL | Status: DC | PRN

## 2019-01-29 MED ORDER — ONDANSETRON HCL 4 MG/2ML IJ SOLN
INTRAMUSCULAR | Status: DC | PRN
  Administered 2019-01-29: 4 mg via INTRAVENOUS

## 2019-01-29 MED ORDER — HEPARIN (PORCINE) IN NACL 1000-0.9 UT/500ML-% IV SOLN
INTRAVENOUS | Status: AC
  Filled 2019-01-29: qty 500

## 2019-01-29 MED ORDER — DEXAMETHASONE SODIUM PHOSPHATE 10 MG/ML IJ SOLN
INTRAMUSCULAR | Status: DC | PRN
  Administered 2019-01-29: 10 mg via INTRAVENOUS

## 2019-01-29 MED ORDER — SODIUM CHLORIDE 0.9 % IV SOLN: 250.0000 mL | INTRAVENOUS | Status: DC | PRN

## 2019-01-29 MED ORDER — SODIUM CHLORIDE 0.9% FLUSH: 3.0000 mL | INTRAVENOUS | Status: DC | PRN

## 2019-01-29 MED ORDER — ROCURONIUM BROMIDE 50 MG/5ML IV SOSY
PREFILLED_SYRINGE | INTRAVENOUS | Status: DC | PRN
  Administered 2019-01-29 (×2): 50 mg via INTRAVENOUS

## 2019-01-29 MED ORDER — PHENYLEPHRINE 40 MCG/ML (10ML) SYRINGE FOR IV PUSH (FOR BLOOD PRESSURE SUPPORT)
PREFILLED_SYRINGE | INTRAVENOUS | Status: DC | PRN
  Administered 2019-01-29: 200 ug via INTRAVENOUS

## 2019-01-29 MED ORDER — LIDOCAINE 2% (20 MG/ML) 5 ML SYRINGE
INTRAMUSCULAR | Status: DC | PRN
  Administered 2019-01-29: 100 mg via INTRAVENOUS

## 2019-01-29 MED ORDER — HEPARIN (PORCINE) IN NACL 2-0.9 UNITS/ML
INTRAMUSCULAR | Status: AC | PRN
  Administered 2019-01-29 (×2): 500 mL

## 2019-01-29 MED ORDER — BUPIVACAINE HCL (PF) 0.25 % IJ SOLN
INTRAMUSCULAR | Status: AC
  Filled 2019-01-29: qty 30

## 2019-01-29 MED ORDER — SODIUM CHLORIDE 0.9% FLUSH: 3.0000 mL | Freq: Two times a day (BID) | INTRAVENOUS | Status: DC

## 2019-01-29 MED ORDER — DEXMEDETOMIDINE HCL 200 MCG/2ML IV SOLN
INTRAVENOUS | Status: DC | PRN
  Administered 2019-01-29: 20 ug via INTRAVENOUS

## 2019-01-29 MED ORDER — SODIUM CHLORIDE 0.9 % IV SOLN: INTRAVENOUS | Status: DC

## 2019-01-29 MED ORDER — FENTANYL CITRATE (PF) 100 MCG/2ML IJ SOLN
INTRAMUSCULAR | Status: DC | PRN
  Administered 2019-01-29: 100 ug via INTRAVENOUS

## 2019-01-29 SURGICAL SUPPLY — 12 items
BAG SNAP BAND KOVER 36X36 (MISCELLANEOUS) ×2 IMPLANT
CATH EZ STEER NAV 8MM F-J CUR (ABLATOR) ×2 IMPLANT
CATH WEBSTER BI DIR CS D-F CRV (CATHETERS) ×2 IMPLANT
DEVICE CLOSURE PERCLS PRGLD 6F (VASCULAR PRODUCTS) IMPLANT
PACK EP LATEX FREE (CUSTOM PROCEDURE TRAY) ×2
PACK EP LF (CUSTOM PROCEDURE TRAY) ×1 IMPLANT
PAD PRO RADIOLUCENT 2001M-C (PAD) ×3 IMPLANT
PATCH CARTO3 (PAD) ×2 IMPLANT
PERCLOSE PROGLIDE 6F (VASCULAR PRODUCTS) ×6
SHEATH PINNACLE 7F 10CM (SHEATH) ×2 IMPLANT
SHEATH PINNACLE 8F 10CM (SHEATH) ×2 IMPLANT
SHEATH PROBE COVER 6X72 (BAG) ×2 IMPLANT

## 2019-01-29 NOTE — Discharge Instructions (Signed)
Post procedure care instructions No driving for 4 days. No lifting over 5 lbs for 1 week. No vigorous or sexual activity for 1 week. You may return to work/your usual activities 02/05/2019. Keep procedure site clean & dry. If you notice increased pain, swelling, bleeding or pus, call/return!  You may shower, but no soaking baths/hot tubs/pools for 1 week.     Cardiac Ablation, Care After This sheet gives you information about how to care for yourself after your procedure. Your health care provider may also give you more specific instructions. If you have problems or questions, contact your health care provider. What can I expect after the procedure? After the procedure, it is common to have:  Bruising around your puncture site.  Tenderness around your puncture site.  Skipped heartbeats.  Tiredness (fatigue). Follow these instructions at home: Puncture site care   Follow instructions from your health care provider about how to take care of your puncture site. Make sure you: ? Wash your hands with soap and water before you change your bandage (dressing). If soap and water are not available, use hand sanitizer. ? Change your dressing as told by your health care provider. ? Leave stitches (sutures), skin glue, or adhesive strips in place. These skin closures may need to stay in place for up to 2 weeks. If adhesive strip edges start to loosen and curl up, you may trim the loose edges. Do not remove adhesive strips completely unless your health care provider tells you to do that.  Check your puncture site every day for signs of infection. Check for: ? Redness, swelling, or pain. ? Fluid or blood. If your puncture site starts to bleed, lie down on your back, apply firm pressure to the area, and contact your health care provider. ? Warmth. ? Pus or a bad smell. Driving  Ask your health care provider when it is safe for you to drive again after the procedure.  Do not drive or use heavy  machinery while taking prescription pain medicine.  Do not drive for 24 hours if you were given a medicine to help you relax (sedative) during your procedure. Activity  Avoid activities that take a lot of effort for at least 3 days after your procedure.  Do not lift anything that is heavier than 10 lb (4.5 kg), or the limit that you are told, until your health care provider says that it is safe.  Return to your normal activities as told by your health care provider. Ask your health care provider what activities are safe for you. General instructions  Take over-the-counter and prescription medicines only as told by your health care provider.  Do not use any products that contain nicotine or tobacco, such as cigarettes and e-cigarettes. If you need help quitting, ask your health care provider.  Do not take baths, swim, or use a hot tub until your health care provider approves.  Do not drink alcohol for 24 hours after your procedure.  Keep all follow-up visits as told by your health care provider. This is important. Contact a health care provider if:  You have redness, mild swelling, or pain around your puncture site.  You have fluid or blood coming from your puncture site that stops after applying firm pressure to the area.  Your puncture site feels warm to the touch.  You have pus or a bad smell coming from your puncture site.  You have a fever.  You have chest pain or discomfort that spreads to your  neck, jaw, or arm.  You are sweating a lot.  You feel nauseous.  You have a fast or irregular heartbeat.  You have shortness of breath.  You are dizzy or light-headed and feel the need to lie down.  You have pain or numbness in the arm or leg closest to your puncture site. Get help right away if:  Your puncture site suddenly swells.  Your puncture site is bleeding and the bleeding does not stop after applying firm pressure to the area. These symptoms may represent a  serious problem that is an emergency. Do not wait to see if the symptoms will go away. Get medical help right away. Call your local emergency services (911 in the [place] Do not drive yourself to the hospital. Summary  After the procedure, it is normal to have bruising and tenderness at the puncture site in your groin, neck, or forearm.  Check your puncture site every day for signs of infection.  Get help right away if your puncture site is bleeding and the bleeding does not stop after applying firm pressure to the area. This is a medical emergency. This information is not intended to replace advice given to you by your health care provider. Make sure you discuss any questions you have with your health care provider. Document Released: 05/05/2016 Document Revised: 01/06/2017 Document Reviewed: 05/05/2016 Elsevier Patient Education  2020 [employer].

## 2019-01-29 NOTE — Anesthesia Postprocedure Evaluation (Signed)
Anesthesia Post Note  Patient: Ryan Harper  Procedure(s) Performed: A-FLUTTER ABLATION (N/A )     Anesthesia Type: General Level of consciousness: awake and alert Pain management: pain level controlled Vital Signs Assessment: post-procedure vital signs reviewed and stable Respiratory status: spontaneous breathing, nonlabored ventilation and respiratory function stable Cardiovascular status: blood pressure returned to baseline and stable Postop Assessment: no apparent nausea or vomiting Anesthetic complications: no    Last Vitals:  Vitals:   01/29/19 1001 01/29/19 1025  BP: 108/71 103/64  Pulse: 90 98  Resp: 16   Temp: 36.7 C   SpO2:  94%    Last Pain:  Vitals:   01/29/19 1001  TempSrc: Oral  PainSc: 0-No pain                 Lidia Collum

## 2019-01-29 NOTE — Progress Notes (Signed)
Dr Camnitz in to see pt. 

## 2019-01-29 NOTE — H&P (Signed)
Ryan Harper has presented today for surgery, with the diagnosis of atrial flutter.  The various methods of treatment have been discussed with the patient and family. After consideration of risks, benefits and other options for treatment, the patient has consented to  Procedure(s): Catheter ablation as a surgical intervention .  Risks include but not limited to bleeding, tamponade, heart block, stroke, damage to surrounding organs, among others. The patient's history has been reviewed, patient examined, no change in status, stable for surgery.  I have reviewed the patient's chart and labs.  Questions were answered to the patient's satisfaction.    Renda Pohlman Curt Bears, MD 01/29/2019 7:08 AM

## 2019-01-29 NOTE — Transfer of Care (Signed)
Immediate Anesthesia Transfer of Care Note  Patient: Ryan Harper  Procedure(s) Performed: A-FLUTTER ABLATION (N/A )  Patient Location: Cath Lab  Anesthesia Type:General  Level of Consciousness: drowsy and patient cooperative  Airway & Oxygen Therapy: Patient Spontanous Breathing and Patient connected to nasal cannula oxygen  Post-op Assessment: Report given to RN and Post -op Vital signs reviewed and stable  Post vital signs: Reviewed and stable  Last Vitals:  Vitals Value Taken Time  BP 119/67 01/29/19 0911  Temp    Pulse 91 01/29/19 0911  Resp 11 01/29/19 0911  SpO2 97 % 01/29/19 0911  Vitals shown include unvalidated device data.  Last Pain:  Vitals:   01/29/19 0904  TempSrc: Temporal  PainSc: Asleep         Complications: No apparent anesthesia complications

## 2019-01-29 NOTE — Anesthesia Procedure Notes (Signed)
Procedure Name: Intubation Date/Time: 01/29/2019 7:49 AM Performed by: Lance Coon, CRNA Pre-anesthesia Checklist: Patient identified, Emergency Drugs available, Suction available, Patient being monitored and Timeout performed Patient Re-evaluated:Patient Re-evaluated prior to induction Oxygen Delivery Method: Circle system utilized Preoxygenation: Pre-oxygenation with 100% oxygen Induction Type: IV induction Ventilation: Mask ventilation without difficulty Laryngoscope Size: Miller and 2 Grade View: Grade I Tube type: Oral Tube size: 7.5 mm Number of attempts: 1 Airway Equipment and Method: Stylet Placement Confirmation: ETT inserted through vocal cords under direct vision,  positive ETCO2 and breath sounds checked- equal and bilateral Secured at: 23 cm Tube secured with: Tape Dental Injury: Teeth and Oropharynx as per pre-operative assessment

## 2019-01-29 NOTE — Anesthesia Preprocedure Evaluation (Signed)
Anesthesia Evaluation  Patient identified by MRN, date of birth, ID band Patient awake    Reviewed: Allergy & Precautions, NPO status , Patient's Chart, lab work & pertinent test results  History of Anesthesia Complications Negative for: history of anesthetic complications  Airway Mallampati: II  TM Distance: >3 FB Neck ROM: Full    Dental  (+) Teeth Intact   Pulmonary neg pulmonary ROS,    Pulmonary exam normal        Cardiovascular Normal cardiovascular exam+ dysrhythmias Atrial Fibrillation   H/o pericarditis   Neuro/Psych negative neurological ROS  negative psych ROS   GI/Hepatic negative GI ROS, Neg liver ROS,   Endo/Other  negative endocrine ROS  Renal/GU negative Renal ROS  negative genitourinary   Musculoskeletal negative musculoskeletal ROS (+)   Abdominal   Peds  Hematology negative hematology ROS (+)   Anesthesia Other Findings COVID-19 infection in August. Now negative.  Reproductive/Obstetrics                             Anesthesia Physical Anesthesia Plan  ASA: III  Anesthesia Plan: General   Post-op Pain Management:    Induction: Intravenous  PONV Risk Score and Plan: 2 and Ondansetron, Dexamethasone, Treatment may vary due to age or medical condition and Midazolam  Airway Management Planned: Oral ETT  Additional Equipment: None  Intra-op Plan:   Post-operative Plan: Extubation in OR  Informed Consent: I have reviewed the patients History and Physical, chart, labs and discussed the procedure including the risks, benefits and alternatives for the proposed anesthesia with the patient or authorized representative who has indicated his/her understanding and acceptance.     Dental advisory given  Plan Discussed with:   Anesthesia Plan Comments:         Anesthesia Quick Evaluation

## 2019-01-30 ENCOUNTER — Other Ambulatory Visit: Payer: Self-pay

## 2019-01-30 ENCOUNTER — Ambulatory Visit (INDEPENDENT_AMBULATORY_CARE_PROVIDER_SITE_OTHER): Payer: Managed Care, Other (non HMO) | Admitting: *Deleted

## 2019-01-30 ENCOUNTER — Telehealth: Payer: Self-pay | Admitting: Cardiology

## 2019-01-30 VITALS — HR 70 | Ht 73.0 in

## 2019-01-30 DIAGNOSIS — R002 Palpitations: Secondary | ICD-10-CM | POA: Diagnosis not present

## 2019-01-30 NOTE — Progress Notes (Signed)
1.  Reason for visit: EKG after reported elevated HRs overnight post ablation yesterday.  2.  Name of MD requesting visit:  Camnitz  3. H&P:  AFlutter  4.  ROS related to problem:  See epic  5.  Assessment and plan per MD:   EKG performed showing NSR, HR 70. Will continue to monitor.  Pt advised to call office back if reoccurs.

## 2019-01-30 NOTE — Telephone Encounter (Signed)
I spoke with pt. He had ablation yesterday. He woke up at 3 AM with pounding and fast heart beat. No dizziness. Lasted about 30 minutes. Had another episode at 7 AM which woke him up. This lasted about 15-20 minutes.   Currently he is feeling fine. He is asking if this is normal and if he needs to take as needed Cardizem when he has these episodes

## 2019-01-30 NOTE — Telephone Encounter (Signed)
Dr Curt Bears would like pt to have EKG.  I spoke with pt and he will come to office at 2:00 today for an EKG

## 2019-01-30 NOTE — Telephone Encounter (Signed)
Patient calling to follow up after his surgery yesterday 12/22. He states he woke up around 3am with a fast heart rate and then again around 7am, but it was not as fast. He wants to know if this is normal after the surgery.

## 2019-02-05 ENCOUNTER — Encounter: Payer: Self-pay | Admitting: Cardiology

## 2019-02-05 ENCOUNTER — Telehealth: Payer: Self-pay | Admitting: Cardiology

## 2019-02-05 NOTE — Telephone Encounter (Signed)
error 

## 2019-02-05 NOTE — Telephone Encounter (Signed)
Patient calling to follow up about the paper work he dropped off 12/23. He would like a call back.

## 2019-02-07 ENCOUNTER — Telehealth: Payer: Self-pay | Admitting: Cardiology

## 2019-02-07 NOTE — Telephone Encounter (Signed)
Pt inquiring about FMLA paperwork dropped off last week. Pt aware that it is in the office to be completed by Dr. Curt Bears next week, as he is out of the office this week. Patient verbalized understanding and agreeable to plan.

## 2019-02-07 NOTE — Telephone Encounter (Signed)
New Message:    Pt wants to know if Dr Curt Bears wants him to continue to take Diltiazem? If so, he said he will need it called in to his pharmacy please.

## 2019-02-07 NOTE — Addendum Note (Signed)
Addended by: Stanton Kidney on: 02/07/2019 08:26 AM   Modules accepted: Orders

## 2019-02-12 NOTE — Telephone Encounter (Signed)
Paperwork completed and faxed late yesterday afternoon. Pt notified this morning.

## 2019-02-19 ENCOUNTER — Encounter: Payer: Self-pay | Admitting: *Deleted

## 2019-02-19 ENCOUNTER — Telehealth: Payer: Self-pay | Admitting: Cardiology

## 2019-02-19 NOTE — Telephone Encounter (Signed)
New Message    Pt is calling and is requesting a note for work stating that he can return with no restrictions Please Fax  Fax # [phone]

## 2019-02-20 NOTE — Telephone Encounter (Signed)
Called pt to inform him that I tried multiple times, unsuccessfully, yesterday afternoon to fax requested letter. New fax number given: [phone] Fax successful, transmission received.

## 2019-03-04 ENCOUNTER — Encounter: Payer: Self-pay | Admitting: *Deleted

## 2019-03-04 ENCOUNTER — Ambulatory Visit (INDEPENDENT_AMBULATORY_CARE_PROVIDER_SITE_OTHER): Payer: Managed Care, Other (non HMO) | Admitting: Cardiology

## 2019-03-04 ENCOUNTER — Encounter: Payer: Self-pay | Admitting: Cardiology

## 2019-03-04 ENCOUNTER — Other Ambulatory Visit: Payer: Self-pay

## 2019-03-04 VITALS — BP 124/78 | HR 68 | Ht 71.0 in | Wt 269.4 lb

## 2019-03-04 DIAGNOSIS — I483 Typical atrial flutter: Secondary | ICD-10-CM | POA: Diagnosis not present

## 2019-03-04 NOTE — Progress Notes (Signed)
Electrophysiology Office Note   Date:  03/04/2019   ID:  Ryan Harper, DOB 09-24-1985, MRN [id number]  PCP:  Orland Mustard, MD  Cardiologist:   Primary Electrophysiologist:  Khaliah Barnick Jorja Loa, MD    No chief complaint on file.    History of Present Illness: Ryan Harper is a 34 y.o. male who is being seen today for the evaluation of WCT at the request of Orland Mustard, MD. Presenting today for electrophysiology evaluation.  He has a history of atrial flutter and pericarditis.  He was seen in the hospital March 2020 with a flulike illness when he developed chest heaviness, palpitations.  He was found to be in atrial flutter.  He was started on NSAIDs and colchicine.  He was admitted to the hospital with palpitations found to have heart rates in the 230s.  He was given an amiodarone bolus and converted to atrial flutter.  He is status post atrial flutter ablation 01/29/2019.  Today, denies symptoms of palpitations, chest pain, shortness of breath, orthopnea, PND, lower extremity edema, claudication, dizziness, presyncope, syncope, bleeding, or neurologic sequela. The patient is tolerating medications without difficulties.  He is currently feeling well.  He has had no further episodes of palpitations.  He has much less weakness, fatigue, and shortness of breath.  He was unaware of his symptoms prior to ablation.   Past Medical History:  Diagnosis Date  . History of recurrent UTIs   . Pericarditis   . Screening for HIV (human immunodeficiency virus)    Past Surgical History:  Procedure Laterality Date  . A-FLUTTER ABLATION N/A 01/29/2019   Procedure: A-FLUTTER ABLATION;  Surgeon: Regan Lemming, MD;  [facility];  Service: Cardiovascular;  Laterality: N/A;  . TYMPANOSTOMY TUBE PLACEMENT    . TYMPANOSTOMY TUBE PLACEMENT  1992     Current Outpatient Medications  Medication Sig Dispense Refill  . acetaminophen (TYLENOL) 500 MG tablet Take 500 mg by  mouth every 6 (six) hours as needed (pain).     [name] ibuprofen (ADVIL) 800 MG tablet Take 1 tablet (800 mg total) by mouth every 8 (eight) hours as needed. 60 tablet 1  . Multiple Vitamin (MULTIVITAMIN WITH MINERALS) TABS tablet Take 1 tablet by mouth daily.     No current facility-administered medications for this visit.    Allergies:   Penicillins   Social History:  The patient  reports that he has never smoked. He has never used smokeless tobacco. He reports current alcohol use. He reports that he does not use drugs.   Family History:  The patient's Family history is unknown by patient.   ROS:  Please see the history of present illness.   Otherwise, review of systems is positive for none.   All other systems are reviewed and negative.   PHYSICAL EXAM: VS:  BP 124/78   Pulse 68   Ht 5\' 11"  (1.803 m)   Wt 269 lb 6.4 oz (122.2 kg)   SpO2 98%   BMI 37.57 kg/m  , BMI Body mass index is 37.57 kg/m. GEN: Well nourished, well developed, in no acute distress  HEENT: normal  Neck: no JVD, carotid bruits, or masses Cardiac: RRR; no murmurs, rubs, or gallops,no edema  Respiratory:  clear to auscultation bilaterally, normal work of breathing GI: soft, nontender, nondistended, + BS MS: no deformity or atrophy  Skin: warm and dry Neuro:  Strength and sensation are intact Psych: euthymic mood, full affect  EKG:  EKG is ordered today. Personal  review of the ekg ordered shows sinus rhythm, rate 60  Recent Labs: 04/25/2018: ALT 53 01/29/2019: BUN 15; Creatinine, Ser 0.97; Hemoglobin 16.6; Platelets 337; Potassium 3.9; Sodium 140    Lipid Panel     Component Value Date/Time   CHOL 233 (H) 12/18/2017 0927   TRIG 191.0 (H) 12/18/2017 0927   HDL 48.60 12/18/2017 0927   CHOLHDL 5 12/18/2017 0927   VLDL 38.2 12/18/2017 0927   LDLCALC 147 (H) 12/18/2017 0927     Wt Readings from Last 3 Encounters:  03/04/19 269 lb 6.4 oz (122.2 kg)  01/29/19 250 lb (113.4 kg)  10/25/18 233 lb (105.7  kg)      Other studies Reviewed: Additional studies/ records that were reviewed today include: TTE 03/02/18  Review of the above records today demonstrates:  - Left ventricle: The cavity size was normal. Wall thickness was   normal. Systolic function was normal. The estimated ejection   fraction was in the range of 55% to 60%. Wall motion was normal;   there were no regional wall motion abnormalities. The study is   not technically sufficient to allow evaluation of LV diastolic   function. - Left atrium: The atrium was mildly dilated. - Right atrium: The atrium was mildly dilated. - Atrial septum: No defect or patent foramen ovale was identified.    ASSESSMENT AND PLAN:  1.  Atrial flutter: Ablation 01/29/2019.  Currently on Eliquis.  CHA2DS2-VASc of 0.  As it has been greater than 4 weeks since his ablation, we Jai Bear plan to stop his Eliquis and diltiazem.  2.  Pericarditis: Currently feeling well  Current medicines are reviewed at length with the patient today.   The patient does not have concerns regarding his medicines.  The following changes were made today: Stop Eliquis, diltiazem  Labs/ tests ordered today include:  Orders Placed This Encounter  Procedures  . EKG 12-Lead     Disposition:   FU with Myriah Boggus as needed months  Signed, Delante Karapetyan Meredith Leeds, MD  03/04/2019 2:30 PM     [place]  [address] [phone] (office) [phone] (fax)

## 2019-03-04 NOTE — Patient Instructions (Signed)
Medication Instructions:  Your physician has recommended you make the following change in your medication:  1. STOP Eliquis 2. STOP Diltiazem  * If you need a refill on your cardiac medications before your next appointment, please call your pharmacy.   Labwork: None ordered  Testing/Procedures: None ordered  Follow-Up: No follow up is needed at this time with Dr. Elberta Fortis.  He will see you on an as needed basis.   Thank you for choosing [facility]!!   Dory Horn, RN [phone]  Any Other Special Instructions Will Be Listed Below (If Applicable).

## 2019-03-06 NOTE — Progress Notes (Signed)
NEUROLOGY CONSULTATION NOTE  Ryan Harper MRN: [id number] DOB: August 05, 1985  Referring provider: Orland Mustard, MD Primary care provider: Orland Mustard, MD  Reason for consult:  headache  HISTORY OF PRESENT ILLNESS: Ryan Harper is a 34 year old male with atrial flutter history of pericarditis who presents for headache.  History supplemented by referring provider and cardiology notes.  He was diagnosed with Covid in August after exhibiting body aches, fatigue, fever.  He started experiencing palpitations and was found to have atrial flutter.  He was started on diltiazem and Eliquis.  Around this same time, he started experiencing different headaches.  Most prominently, he would develop a 9/10 headache with his palpitations.  If he would crouch or lean forward, the palpitations would subside as well as the headache.  On a couple of occasions, when he had an orgasm, he states that his face and jaw locked up for a moment but no associated headache.  He would wake up in the middle of the night with a pinpoint throbbing left greater than right temporal headache lasting a couple of hours until he took an analgesic or fell back to sleep.  When he looks up, he feels a sensation of dripping running down the back of his head.  Sometimes he sees flashes and peripheral vision loss.  He subsequently underwent ablation for his atrial flutter and is no longer on diltiazem or anticoagulation.  He no longer has palpitations and headaches have improved.  However, he still wakes up with 6-7/10 pinpoint headache most days of the week.  His head, especially on the left sided, feels sore and tender.  He takes ibuprofen or acetaminophen every other day.    PAST MEDICAL HISTORY: Past Medical History:  Diagnosis Date  . History of recurrent UTIs   . Pericarditis   . Screening for HIV (human immunodeficiency virus)     PAST SURGICAL HISTORY: Past Surgical History:  Procedure Laterality Date  . A-FLUTTER  ABLATION N/A 01/29/2019   Procedure: A-FLUTTER ABLATION;  Surgeon: Regan Lemming, MD;  Location[facility];  Service: Cardiovascular;  Laterality: N/A;  . TYMPANOSTOMY TUBE PLACEMENT    . TYMPANOSTOMY TUBE PLACEMENT  1992    MEDICATIONS: Current Outpatient Medications on File Prior to Visit  Medication Sig Dispense Refill  . acetaminophen (TYLENOL) 500 MG tablet Take 500 mg by mouth every 6 (six) hours as needed (pain).     [name] ibuprofen (ADVIL) 800 MG tablet Take 1 tablet (800 mg total) by mouth every 8 (eight) hours as needed. 60 tablet 1  . Multiple Vitamin (MULTIVITAMIN WITH MINERALS) TABS tablet Take 1 tablet by mouth daily.     No current facility-administered medications on file prior to visit.    ALLERGIES: Allergies  Allergen Reactions  . Penicillins Other (See Comments)    Did it involve swelling of the face/tongue/throat, SOB, or low BP? Unknown Did it involve sudden or severe rash/hives, skin peeling, or any reaction on the inside of your mouth or nose? Unknown Did you need to seek medical attention at a hospital or doctor's office? Unknown When did it last happen?always told not to take If all above answers are "NO", may proceed with cephalosporin use.     FAMILY HISTORY: Family History  Family history unknown: Yes    SOCIAL HISTORY: Social History   Socioeconomic History  . Marital status: Single    Spouse name: Not on file  . Number of children: Not on file  . Years of  education: Not on file  . Highest education level: Not on file  Occupational History  . Not on file  Tobacco Use  . Smoking status: Never Smoker  . Smokeless tobacco: Never Used  Substance and Sexual Activity  . Alcohol use: Yes    Comment: social  . Drug use: No  . Sexual activity: Yes  Other Topics Concern  . Not on file  Social History Narrative  . Not on file   Social Determinants of Health   Financial Resource Strain:   . Difficulty of Paying Living  Expenses: Not on file  Food Insecurity:   . Worried About Programme researcher, broadcasting/film/video in the Last Year: Not on file  . Ran Out of Food in the Last Year: Not on file  Transportation Needs:   . Lack of Transportation (Medical): Not on file  . Lack of Transportation (Non-Medical): Not on file  Physical Activity:   . Days of Exercise per Week: Not on file  . Minutes of Exercise per Session: Not on file  Stress:   . Feeling of Stress : Not on file  Social Connections:   . Frequency of Communication with Friends and Family: Not on file  . Frequency of Social Gatherings with Friends and Family: Not on file  . Attends Religious Services: Not on file  . Active Member of Clubs or Organizations: Not on file  . Attends Banker Meetings: Not on file  . Marital Status: Not on file  Intimate Partner Violence:   . Fear of Current or Ex-Partner: Not on file  . Emotionally Abused: Not on file  . Physically Abused: Not on file  . Sexually Abused: Not on file    PHYSICAL EXAM: Blood pressure 139/82, pulse 98, height 5\' 11"  (1.803 m), weight 271 lb (122.9 kg), SpO2 99 %. General: No acute distress.  Patient appears well-groomed.  Head:  Normocephalic/atraumatic Eyes:  fundi examined but not visualized Neck: supple, no paraspinal tenderness, full range of motion Back: No paraspinal tenderness Heart: regular rate and rhythm Lungs: Clear to auscultation bilaterally. Vascular: No carotid bruits. Neurological Exam: Mental status: alert and oriented to person, place, and time, recent and remote memory intact, fund of knowledge intact, attention and concentration intact, speech fluent and not dysarthric, language intact. Cranial nerves: CN I: not tested CN II: pupils equal, round and reactive to light, visual fields intact CN III, IV, VI:  full range of motion, no nystagmus, no ptosis CN V: facial sensation intact CN VII: upper and lower face symmetric CN VIII: hearing intact CN IX, X: gag  intact, uvula midline CN XI: sternocleidomastoid and trapezius muscles intact CN XII: tongue midline Bulk & Tone: normal, no fasciculations. Motor:  5/5 throughout  Sensation: temperature and vibration sensation intact. Deep Tendon Reflexes:  2+ throughout, toes downgoing.  Finger to nose testing:  Without dysmetria. Heel to shin:  Without dysmetria.  Gait:  Normal station and stride.  Able to turn and tandem walk. Romberg negative.  IMPRESSION: Headache, unspecified.  Possibly residual from Covid infection.  Unclear if related to prior atrial flutter.  Given his history and how these are new symptoms for him, further testing warranted.  PLAN: 1.  MRI and MRA of head 2.  Limit use of pain relievers to no more than 2 days out of week to prevent risk of rebound or medication-overuse headache. 3.  Defers starting a preventative medication for headache at this time. 4.  Follow up in 3 months or  sooner  Thank you for allowing me to take part in the care of this patient.  Metta Clines, DO  CC: Orma Flaming, MD

## 2019-03-07 ENCOUNTER — Other Ambulatory Visit: Payer: Self-pay

## 2019-03-07 ENCOUNTER — Ambulatory Visit (INDEPENDENT_AMBULATORY_CARE_PROVIDER_SITE_OTHER): Payer: Managed Care, Other (non HMO) | Admitting: Neurology

## 2019-03-07 ENCOUNTER — Encounter: Payer: Self-pay | Admitting: Neurology

## 2019-03-07 VITALS — BP 139/82 | HR 98 | Ht 71.0 in | Wt 271.0 lb

## 2019-03-07 DIAGNOSIS — I4892 Unspecified atrial flutter: Secondary | ICD-10-CM

## 2019-03-07 DIAGNOSIS — Z8616 Personal history of COVID-19: Secondary | ICD-10-CM | POA: Diagnosis not present

## 2019-03-07 DIAGNOSIS — R519 Headache, unspecified: Secondary | ICD-10-CM | POA: Diagnosis not present

## 2019-03-07 NOTE — Patient Instructions (Addendum)
1.  Check MRI and MRA of head 2.  Limit use of pain relievers to no more than 2 days out of week to prevent risk of rebound or medication-overuse headache. 3.  Follow up in 3 months.  Further recommendations pending results of MRI  We have sent a referral to [facility] for your MRI and they will call you directly to schedule your appointment. They are located at [address] [facility]. If you need to contact them directly please call [phone].

## 2019-03-11 ENCOUNTER — Telehealth: Payer: Self-pay | Admitting: Neurology

## 2019-03-11 NOTE — Telephone Encounter (Signed)
Error- patient calling [facility] to contact them about MRI. He just needed the Number so I gave to him. Thanks!

## 2019-03-29 ENCOUNTER — Ambulatory Visit
Admission: RE | Admit: 2019-03-29 | Discharge: 2019-03-29 | Disposition: A | Discharge status: Home / Self Care | Payer: Managed Care, Other (non HMO) | Source: Ambulatory Visit | Attending: Neurology | Admitting: Neurology

## 2019-03-29 ENCOUNTER — Other Ambulatory Visit: Payer: Self-pay | Admitting: Neurology

## 2019-03-29 ENCOUNTER — Other Ambulatory Visit: Payer: Self-pay

## 2019-03-29 DIAGNOSIS — I4892 Unspecified atrial flutter: Secondary | ICD-10-CM

## 2019-03-29 DIAGNOSIS — Z77018 Contact with and (suspected) exposure to other hazardous metals: Secondary | ICD-10-CM

## 2019-03-29 DIAGNOSIS — R519 Headache, unspecified: Secondary | ICD-10-CM

## 2019-03-29 DIAGNOSIS — Z8616 Personal history of COVID-19: Secondary | ICD-10-CM

## 2019-04-04 ENCOUNTER — Telehealth: Payer: Self-pay | Admitting: Neurology

## 2019-04-04 NOTE — Telephone Encounter (Signed)
Pt called informed that MRI and MRA of brain are both normal.

## 2019-04-04 NOTE — Telephone Encounter (Signed)
Patient called and would like to get his MRI results. Please Call. Thank you

## 2019-04-04 NOTE — Telephone Encounter (Signed)
MRI and MRA of brain are normal

## 2019-05-01 ENCOUNTER — Telehealth: Payer: Self-pay | Admitting: Family Medicine

## 2019-05-01 NOTE — Telephone Encounter (Signed)
FYI

## 2019-05-01 NOTE — Telephone Encounter (Signed)
Nurse Assessment Nurse: Clarita Leber, RN, Deborah Date/Time (Eastern Time): 04/29/2019 3:42:27 PM Confirm and document reason for call. If symptomatic, describe symptoms. ---The caller states that he is having right knee pain that started when he stubbed his right toe. No swelling and is very sensitive. Feels a knot in his arch. Has the patient had close contact with a person known or suspected to have the novel coronavirus illness OR traveled / lives in area with major community spread (including international travel) in the last 14 days from the onset of symptoms? * If Asymptomatic, screen for exposure and travel within the last 14 days. ---No Does the patient have any new or worsening symptoms? ---Yes Will a triage be completed? ---Yes Related visit to physician within the last 2 weeks? ---No Does the PT have any chronic conditions? (i.e. diabetes, asthma, this includes High risk factors for pregnancy, etc.) ---No Is this a behavioral health or substance abuse call? ---No Guidelines Guideline Title Affirmed Question Affirmed Notes Nurse Date/Time Lamount Cohen Time) Knee Pain [1] Redness of the skin AND [2] no fever Womble, RN, Gavin Pound 04/29/2019 3:44:56 PM Disp. Time Lamount Cohen Time) Disposition Final User 04/29/2019 3:08:56 PM Attempt made - no message left Womble, RN, Gavin Pound PLEASE NOTE: All timestamps contained within this report are represented as [place] Standard Time. CONFIDENTIALTY NOTICE: This fax transmission is intended only for the addressee. It contains information that is legally privileged, confidential or otherwise protected from use or disclosure. If you are not the intended recipient, you are strictly prohibited from reviewing, disclosing, copying using or disseminating any of this information or taking any action in reliance on or regarding this information. If you have received this fax in error, please notify us immediately by telephone so that we can arrange for its return to  [place]. Phone: [phone], Toll-Free: [phone], Fax: [phone] Page: 2 of 2 Call Id: [id number] Disp. Time Lamount Cohen Time) Disposition Final User 04/29/2019 3:22:39 PM Attempt made - no message left Clarita Leber, RN, Gavin Pound 04/29/2019 3:50:54 PM See PCP within 24 Hours Yes Clarita Leber, RN, Jetty Duhamel Disagree/Comply Comply Caller Understands Yes PreDisposition Call Doctor Care Advice Given Per Guideline SEE PCP WITHIN 24 HOURS: * IBUPROFEN (E.G., MOTRIN, ADVIL): Take 400 mg (two 200 mg pills) by mouth every 6 hours. The most you should take each day is 1,200 mg (six 200 mg pills), unless your doctor has told you to take more. * NAPROXEN (E.G., ALEVE): Take 220 mg (one 220 mg pill) by mouth every 8 to 12 hours as needed. You may take 440 mg (two 220 mg pills) for your first dose. The most you should take each day is 660 mg (three 220 mg pills a day), unless your doctor has told you to take more. * You become worse.   Patient never scheduled a follow up.

## 2019-05-02 ENCOUNTER — Telehealth: Payer: Self-pay | Admitting: Family Medicine

## 2019-05-02 NOTE — Telephone Encounter (Signed)
error 

## 2019-05-03 ENCOUNTER — Ambulatory Visit: Payer: Managed Care, Other (non HMO) | Admitting: Family Medicine

## 2019-05-03 NOTE — Progress Notes (Deleted)
  Ryan Harper - 34 y.o. male MRN [id number]  Date of birth: 24-Apr-1985  SUBJECTIVE:  Including CC & ROS.  No chief complaint on file.   Ryan Harper is a 34 y.o. male that is  ***.  ***   Review of Systems See HPI   HISTORY: Past Medical, Surgical, Social, and Family History Reviewed & Updated per EMR.   Pertinent Historical Findings include:  Past Medical History:  Diagnosis Date  . History of recurrent UTIs   . Pericarditis   . Screening for HIV (human immunodeficiency virus)     Past Surgical History:  Procedure Laterality Date  . A-FLUTTER ABLATION N/A 01/29/2019   Procedure: A-FLUTTER ABLATION;  Surgeon: Regan Lemming, MD;  Location: [facility];  Service: Cardiovascular;  Laterality: N/A;  . TYMPANOSTOMY TUBE PLACEMENT    . TYMPANOSTOMY TUBE PLACEMENT  1992    Family History  Family history unknown: Yes    Social History   Socioeconomic History  . Marital status: Single    Spouse name: Not on file  . Number of children: Not on file  . Years of education: Not on file  . Highest education level: Not on file  Occupational History  . Not on file  Tobacco Use  . Smoking status: Never Smoker  . Smokeless tobacco: Never Used  Substance and Sexual Activity  . Alcohol use: Yes    Comment: social  . Drug use: No  . Sexual activity: Yes  Other Topics Concern  . Not on file  Social History Narrative   Right handed   Two story home   Drinks caffeine occasionally   Freight forwarder   Social Determinants of Health   Financial Resource Strain:   . Difficulty of Paying Living Expenses:   Food Insecurity:   . Worried About Programme researcher, broadcasting/film/video in the Last Year:   . Barista in the Last Year:   Transportation Needs:   . Freight forwarder (Medical):   [name] Lack of Transportation (Non-Medical):   Physical Activity:   . Days of Exercise per Week:   . Minutes of Exercise per Session:   Stress:   . Feeling of Stress :   Social  Connections:   . Frequency of Communication with Friends and Family:   . Frequency of Social Gatherings with Friends and Family:   . Attends Religious Services:   . Active Member of Clubs or Organizations:   . Attends Banker Meetings:   [name] Marital Status:   Intimate Partner Violence:   . Fear of Current or Ex-Partner:   . Emotionally Abused:   [name] Physically Abused:   . Sexually Abused:      PHYSICAL EXAM:  VS: There were no vitals taken for this visit. Physical Exam Gen: NAD, alert, cooperative with exam, well-appearing MSK:  ***      ASSESSMENT & PLAN:   No problem-specific Assessment & Plan notes found for this encounter.

## 2019-05-13 ENCOUNTER — Telehealth: Payer: Self-pay

## 2019-05-13 ENCOUNTER — Ambulatory Visit: Payer: Managed Care, Other (non HMO) | Attending: Internal Medicine

## 2019-05-13 DIAGNOSIS — Z20822 Contact with and (suspected) exposure to covid-19: Secondary | ICD-10-CM

## 2019-05-13 NOTE — Telephone Encounter (Signed)
Patient states that he faxed over paper work today regarding pending results for covid-19/ Patient would like to know when this paper work is completed could some one email this information to him at  [email or web address] patient states if unable to email he will have his wife pick up forms

## 2019-05-14 LAB — SARS-COV-2, NAA 2 DAY TAT

## 2019-05-14 LAB — NOVEL CORONAVIRUS, NAA: SARS-CoV-2, NAA: NOT DETECTED

## 2019-05-15 ENCOUNTER — Telehealth: Payer: Managed Care, Other (non HMO) | Admitting: Family Medicine

## 2019-05-15 ENCOUNTER — Telehealth: Payer: Self-pay | Admitting: Family Medicine

## 2019-05-15 NOTE — Telephone Encounter (Signed)
First time seeing this. I only have that one form from him. It was received on 05/13/2019. The paperwork does not list anything about Covid 19.

## 2019-05-15 NOTE — Telephone Encounter (Signed)
Have you seen these? Dr. Artis Flock

## 2019-05-15 NOTE — Telephone Encounter (Signed)
Spoke with pt to verify why paperwork needs to filled out. 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 Pt says that he had a Covid test at [facility] on Monday. He is still waiting for results. Then he states trying to cancel the trip and it is insurance paperwork. I recommended that he schedules an appointment.

## 2019-05-15 NOTE — Telephone Encounter (Signed)
Patient called in asking if Dr.Wolfe received the fax he sent to the office on Monday, states it was a form that explains that he has been in quarantine, asked if someone could give him a call as soon as possible.

## 2019-05-15 NOTE — Telephone Encounter (Signed)
Absolutely agree he needs an appointment. Virtual is fine.  Orland Mustard, MD [place] [facility]

## 2019-05-16 ENCOUNTER — Encounter: Payer: Self-pay | Admitting: Family Medicine

## 2019-05-16 ENCOUNTER — Telehealth: Payer: Managed Care, Other (non HMO) | Admitting: Family Medicine

## 2019-05-16 VITALS — Ht 71.0 in | Wt 271.0 lb

## 2019-05-16 NOTE — Progress Notes (Signed)
Error. Patient could not connect and rescheduled.  Orland Mustard, MD [place] [facility]

## 2019-05-17 ENCOUNTER — Encounter: Payer: Self-pay | Admitting: Family Medicine

## 2019-05-17 ENCOUNTER — Telehealth (INDEPENDENT_AMBULATORY_CARE_PROVIDER_SITE_OTHER): Payer: Managed Care, Other (non HMO) | Admitting: Family Medicine

## 2019-05-17 VITALS — Ht 71.0 in | Wt 271.0 lb

## 2019-05-17 DIAGNOSIS — R6889 Other general symptoms and signs: Secondary | ICD-10-CM | POA: Diagnosis not present

## 2019-05-17 NOTE — Progress Notes (Signed)
Virtual Visit via Video   Due to the COVID-19 pandemic, this visit was completed with telemedicine (audio/video) technology to reduce patient and provider exposure as well as to preserve personal protective equipment.   I connected with Ann Maki by a video enabled telemedicine application and verified that I am speaking with the correct person using two identifiers. Location patient: Home Location provider: [facility], Office Persons participating in the virtual visit: Torris House, Orma Flaming, MD   I discussed the limitations of evaluation and management by telemedicine and the availability of in person appointments. The patient expressed understanding and agreed to proceed.  Care Team   Patient Care Team: Orma Flaming, MD as PCP - General (Family Medicine) Pieter Partridge, DO as Consulting Physician (Neurology)  Subjective:   HPI:   Started to feel bad on Monday night. He started to have chills, achyness and headache and vomited x1 on Monday night. On Tuesday he had some diarrhea. He states he had a fever on Monday and Tuesday to 102. He has not had a fever since that time. He has had no more diarrhea or vomiting and his headache is now mild. He had covid 7 months ago, but got tested on 4/5 and this was negative. He is feeling much better now. He is still needing some medication for his head. He is still having some mild stomach pain. He is only taking tylenol. Has not taken any ibuprofen. Overall feels like he is improving. Denies any cough, shortness of breath, diarrhea/nausea/vomiting. Is eating and drinking fine. No other sick contacts.   Review of Systems  Constitutional: Negative for chills and fever.  HENT: Negative for congestion and sore throat.   Respiratory: Negative for cough, shortness of breath and wheezing.   Gastrointestinal: Positive for abdominal pain. Negative for diarrhea and nausea.  Musculoskeletal: Positive for myalgias.  Neurological:  Negative for dizziness and headaches.     Patient Active Problem List   Diagnosis Date Noted  . Arrhythmia 04/26/2018  . Rapid atrial fibrillation ([place]) 04/25/2018    Social History   Tobacco Use  . Smoking status: Never Smoker  . Smokeless tobacco: Never Used  Substance Use Topics  . Alcohol use: Yes    Comment: social    Current Outpatient Medications:  .  acetaminophen (TYLENOL) 500 MG tablet, Take 500 mg by mouth every 6 (six) hours as needed (pain). , Disp: , Rfl:  .  Multiple Vitamin (MULTIVITAMIN WITH MINERALS) TABS tablet, Take 1 tablet by mouth daily., Disp: , Rfl:  .  ibuprofen (ADVIL) 800 MG tablet, Take 1 tablet (800 mg total) by mouth every 8 (eight) hours as needed. (Patient not taking: Reported on 05/16/2019), Disp: 60 tablet, Rfl: 1  Allergies  Allergen Reactions  . Penicillins Other (See Comments)    Did it involve swelling of the face/tongue/throat, SOB, or low BP? Unknown Did it involve sudden or severe rash/hives, skin peeling, or any reaction on the inside of your mouth or nose? Unknown Did you need to seek medical attention at a hospital or doctor's office? Unknown When did it last happen?always told not to take If all above answers are "NO", may proceed with cephalosporin use.     Objective:   VITALS: Per patient if applicable, see vitals. GENERAL: Alert, appears well and in no acute distress. HEENT: Atraumatic, conjunctiva clear, no obvious abnormalities on inspection of external nose and ears. NECK: Normal movements of the head and neck. CARDIOPULMONARY: No increased WOB. Speaking in clear  sentences. I:E ratio WNL.  MS: Moves all visible extremities without noticeable abnormality. PSYCH: Pleasant and cooperative, well-groomed. Speech normal rate and rhythm. Affect is appropriate. Insight and judgement are appropriate. Attention is focused, linear, and appropriate.  NEURO: CN grossly intact. Oriented as arrived to appointment on time with no  prompting. Moves both UE equally.  SKIN: No obvious lesions, wounds, erythema, or cyanosis noted on face or hands.  Depression screen [facility] 2/9 05/16/2019 12/18/2017  Decreased Interest 0 0  Down, Depressed, Hopeless 0 0  PHQ - 2 Score 0 0    Assessment and Plan:   Devontaye was seen today for abdominal pain, headache and covid exposure.  Diagnoses and all orders for this visit:  Flu-like symptoms  discussed he is about 5 days out from symptoms and fever free x 24 hours. He is covid negative and if he has the flu is five days out and no longer needs to be isolated. Filled out paperwork for trip he was supposed to go that he cancelled due to illness. Recommended supportive therapy at this time especially since improving symptoms. He will f/u for regular annual in office.   [name] COVID-19 Education: The signs and symptoms of COVID-19 were discussed with the patient and how to seek care for testing if needed. The importance of social distancing was discussed today. . Reviewed expectations re: course of current medical issues. . Discussed self-management of symptoms. . Outlined signs and symptoms indicating need for more acute intervention. . Patient verbalized understanding and all questions were answered. [name] Health Maintenance issues including appropriate healthy diet, exercise, and smoking avoidance were discussed with patient. . See orders for this visit as documented in the electronic medical record.  Orland Mustard, MD  Records requested if needed. Time spent: 15 minutes, of which >50% was spent in obtaining information about his symptoms, reviewing his previous labs, evaluations, and treatments, counseling him about his condition (please see the discussed topics above), and developing a plan to further investigate it; he had a number of questions which I addressed.

## 2019-05-24 NOTE — Progress Notes (Deleted)
NEUROLOGY FOLLOW UP OFFICE NOTE  Ryan Harper [id number]  HISTORY OF PRESENT ILLNESS: Ryan Harper is a 34 year old male with atrial flutter history of pericarditis who follows up for headache.  UPDATE: MRI and MRA of brain without contrast on 03/29/2019 was personally reviewed and were normal.  ***  HISTORY: He was diagnosed with Covid in August after exhibiting body aches, fatigue, fever.  He started experiencing palpitations and was found to have atrial flutter.  He was started on diltiazem and Eliquis.  Around this same time, he started experiencing different headaches.  Most prominently, he would develop a 9/10 headache with his palpitations.  If he would crouch or lean forward, the palpitations would subside as well as the headache.  On a couple of occasions, when he had an orgasm, he states that his face and jaw locked up for a moment but no associated headache.  He would wake up in the middle of the night with a pinpoint throbbing left greater than right temporal headache lasting a couple of hours until he took an analgesic or fell back to sleep.  When he looks up, he feels a sensation of dripping running down the back of his head.  Sometimes he sees flashes and peripheral vision loss.  He subsequently underwent ablation for his atrial flutter and is no longer on diltiazem or anticoagulation.  He no longer has palpitations and headaches have improved.  However, he still wakes up with 6-7/10 pinpoint headache most days of the week.  His head, especially on the left sided, feels sore and tender.  He takes ibuprofen or acetaminophen every other day.    PAST MEDICAL HISTORY: Past Medical History:  Diagnosis Date  . History of recurrent UTIs   . Pericarditis   . Screening for HIV (human immunodeficiency virus)     MEDICATIONS: Current Outpatient Medications on File Prior to Visit  Medication Sig Dispense Refill  . acetaminophen (TYLENOL) 500 MG tablet Take 500 mg by mouth  every 6 (six) hours as needed (pain).     [name] ibuprofen (ADVIL) 800 MG tablet Take 1 tablet (800 mg total) by mouth every 8 (eight) hours as needed. (Patient not taking: Reported on 05/16/2019) 60 tablet 1  . Multiple Vitamin (MULTIVITAMIN WITH MINERALS) TABS tablet Take 1 tablet by mouth daily.     No current facility-administered medications on file prior to visit.    ALLERGIES: Allergies  Allergen Reactions  . Penicillins Other (See Comments)    Did it involve swelling of the face/tongue/throat, SOB, or low BP? Unknown Did it involve sudden or severe rash/hives, skin peeling, or any reaction on the inside of your mouth or nose? Unknown Did you need to seek medical attention at a hospital or doctor's office? Unknown When did it last happen?always told not to take If all above answers are "NO", may proceed with cephalosporin use.     FAMILY HISTORY: Family History  Family history unknown: Yes   SOCIAL HISTORY: Social History   Socioeconomic History  . Marital status: Single    Spouse name: Not on file  . Number of children: Not on file  . Years of education: Not on file  . Highest education level: Not on file  Occupational History  . Not on file  Tobacco Use  . Smoking status: Never Smoker  . Smokeless tobacco: Never Used  Substance and Sexual Activity  . Alcohol use: Yes    Comment: social  . Drug use: No  . Sexual  activity: Yes  Other Topics Concern  . Not on file  Social History Narrative   Right handed   Two story home   Drinks caffeine occasionally   Tax adviser   Social Determinants of Health   Financial Resource Strain:   . Difficulty of Paying Living Expenses:   Food Insecurity:   . Worried About Charity fundraiser in the Last Year:   . Arboriculturist in the Last Year:   Transportation Needs:   . Film/video editor (Medical):   [name] Lack of Transportation (Non-Medical):   Physical Activity:   . Days of Exercise per Week:   . Minutes of  Exercise per Session:   Stress:   . Feeling of Stress :   Social Connections:   . Frequency of Communication with Friends and Family:   . Frequency of Social Gatherings with Friends and Family:   . Attends Religious Services:   . Active Member of Clubs or Organizations:   . Attends Archivist Meetings:   [name] Marital Status:   Intimate Partner Violence:   . Fear of Current or Ex-Partner:   . Emotionally Abused:   [name] Physically Abused:   . Sexually Abused:     PHYSICAL EXAM: *** General: No acute distress.  Patient appears ***-groomed.   Head:  Normocephalic/atraumatic Eyes:  Fundi examined but not visualized Neck: supple, no paraspinal tenderness, full range of motion Heart:  Regular rate and rhythm Lungs:  Clear to auscultation bilaterally Back: No paraspinal tenderness Neurological Exam: alert and oriented to person, place, and time. Attention span and concentration intact, recent and remote memory intact, fund of knowledge intact.  Speech fluent and not dysarthric, language intact.  CN II-XII intact. Bulk and tone normal, muscle strength 5/5 throughout.  Sensation to light touch, temperature and vibration intact.  Deep tendon reflexes 2+ throughout, toes downgoing.  Finger to nose and heel to shin testing intact.  Gait normal, Romberg negative.  IMPRESSION: Headache.  COVID  PLAN: ***  Metta Clines, DO  CC: ***

## 2019-05-27 ENCOUNTER — Ambulatory Visit: Payer: Managed Care, Other (non HMO) | Admitting: Neurology

## 2019-10-03 NOTE — Progress Notes (Deleted)
NEUROLOGY FOLLOW UP OFFICE NOTE  Ryan Harper [id number]  HISTORY OF PRESENT ILLNESS: Ryan Harper is a 34 year old male with atrial flutter history of pericarditis who follows up for headache.  UPDATE: MRI and MRA of brain on 03/29/2019 personally reviewed were normal.  Deferred starting a preventative at that time.    HISTORY: He was diagnosed with Covid in August after exhibiting body aches, fatigue, fever.  He started experiencing palpitations and was found to have atrial flutter.  He was started on diltiazem and Eliquis.  Around this same time, he started experiencing different headaches.  Most prominently, he would develop a 9/10 headache with his palpitations.  If he would crouch or lean forward, the palpitations would subside as well as the headache.  On a couple of occasions, when he had an orgasm, he states that his face and jaw locked up for a moment but no associated headache.  He would wake up in the middle of the night with a pinpoint throbbing left greater than right temporal headache lasting a couple of hours until he took an analgesic or fell back to sleep.  When he looks up, he feels a sensation of dripping running down the back of his head.  Sometimes he sees flashes and peripheral vision loss.  He subsequently underwent ablation for his atrial flutter and is no longer on diltiazem or anticoagulation.  He no longer has palpitations and headaches have improved.  However, he still wakes up with 6-7/10 pinpoint headache most days of the week.  His head, especially on the left sided, feels sore and tender.  He takes ibuprofen or acetaminophen every other day.    PAST MEDICAL HISTORY: Past Medical History:  Diagnosis Date  . History of recurrent UTIs   . Pericarditis   . Screening for HIV (human immunodeficiency virus)     MEDICATIONS: Current Outpatient Medications on File Prior to Visit  Medication Sig Dispense Refill  . acetaminophen (TYLENOL) 500 MG tablet Take  500 mg by mouth every 6 (six) hours as needed (pain).     [name] ibuprofen (ADVIL) 800 MG tablet Take 1 tablet (800 mg total) by mouth every 8 (eight) hours as needed. (Patient not taking: Reported on 05/16/2019) 60 tablet 1  . Multiple Vitamin (MULTIVITAMIN WITH MINERALS) TABS tablet Take 1 tablet by mouth daily.     No current facility-administered medications on file prior to visit.    ALLERGIES: Allergies  Allergen Reactions  . Penicillins Other (See Comments)    Did it involve swelling of the face/tongue/throat, SOB, or low BP? Unknown Did it involve sudden or severe rash/hives, skin peeling, or any reaction on the inside of your mouth or nose? Unknown Did you need to seek medical attention at a hospital or doctor's office? Unknown When did it last happen?always told not to take If all above answers are "NO", may proceed with cephalosporin use.     FAMILY HISTORY: Family History  Family history unknown: Yes    SOCIAL HISTORY: Social History   Socioeconomic History  . Marital status: Single    Spouse name: Not on file  . Number of children: Not on file  . Years of education: Not on file  . Highest education level: Not on file  Occupational History  . Not on file  Tobacco Use  . Smoking status: Never Smoker  . Smokeless tobacco: Never Used  Vaping Use  . Vaping Use: Never used  Substance and Sexual Activity  . Alcohol use:  Yes    Comment: social  . Drug use: No  . Sexual activity: Yes  Other Topics Concern  . Not on file  Social History Narrative   Right handed   Two story home   Drinks caffeine occasionally   Freight forwarder   Social Determinants of Health   Financial Resource Strain:   . Difficulty of Paying Living Expenses: Not on file  Food Insecurity:   . Worried About Programme researcher, broadcasting/film/video in the Last Year: Not on file  . Ran Out of Food in the Last Year: Not on file  Transportation Needs:   . Lack of Transportation (Medical): Not on file  . Lack of  Transportation (Non-Medical): Not on file  Physical Activity:   . Days of Exercise per Week: Not on file  . Minutes of Exercise per Session: Not on file  Stress:   . Feeling of Stress : Not on file  Social Connections:   . Frequency of Communication with Friends and Family: Not on file  . Frequency of Social Gatherings with Friends and Family: Not on file  . Attends Religious Services: Not on file  . Active Member of Clubs or Organizations: Not on file  . Attends Banker Meetings: Not on file  . Marital Status: Not on file  Intimate Partner Violence:   . Fear of Current or Ex-Partner: Not on file  . Emotionally Abused: Not on file  . Physically Abused: Not on file  . Sexually Abused: Not on file    PHYSICAL EXAM: *** General: No acute distress.  Patient appears well-groomed.   Head:  Normocephalic/atraumatic Eyes:  Fundi examined but not visualized Neck: supple, no paraspinal tenderness, full range of motion Heart:  Regular rate and rhythm Lungs:  Clear to auscultation bilaterally Back: No paraspinal tenderness Neurological Exam: alert and oriented to person, place, and time. Attention span and concentration intact, recent and remote memory intact, fund of knowledge intact.  Speech fluent and not dysarthric, language intact.  CN II-XII intact. Bulk and tone normal, muscle strength 5/5 throughout.  Sensation to light touch, temperature and vibration intact.  Deep tendon reflexes 2+ throughout, toes downgoing.  Finger to nose and heel to shin testing intact.  Gait normal, Romberg negative.  IMPRESSION: 1.  Headache, likely post-Covid  PLAN: ***  Shon Millet, DO  CC: Orland Mustard, MD

## 2019-10-04 ENCOUNTER — Ambulatory Visit: Payer: Managed Care, Other (non HMO) | Admitting: Neurology

## 2019-10-25 ENCOUNTER — Encounter (HOSPITAL_COMMUNITY): Payer: Self-pay | Admitting: Emergency Medicine

## 2019-10-25 ENCOUNTER — Emergency Department (HOSPITAL_COMMUNITY)
Admission: EM | Admit: 2019-10-25 | Discharge: 2019-10-25 | Disposition: A | Discharge status: Home / Self Care | Payer: Managed Care, Other (non HMO) | Attending: Emergency Medicine | Admitting: Emergency Medicine

## 2019-10-25 ENCOUNTER — Other Ambulatory Visit: Payer: Self-pay

## 2019-10-25 DIAGNOSIS — S161XXA Strain of muscle, fascia and tendon at neck level, initial encounter: Secondary | ICD-10-CM | POA: Diagnosis not present

## 2019-10-25 DIAGNOSIS — X501XXA Overexertion from prolonged static or awkward postures, initial encounter: Secondary | ICD-10-CM | POA: Diagnosis not present

## 2019-10-25 DIAGNOSIS — S199XXA Unspecified injury of neck, initial encounter: Secondary | ICD-10-CM | POA: Diagnosis present

## 2019-10-25 MED ORDER — CYCLOBENZAPRINE HCL 10 MG PO TABS: 10.0000 mg | ORAL_TABLET | Freq: Two times a day (BID) | ORAL | 0 refills | Status: DC | PRN

## 2019-10-25 NOTE — ED Triage Notes (Signed)
Per patient, states he must have slept wrong-having neck pain radiating into shoulders

## 2019-10-25 NOTE — Discharge Instructions (Signed)
  When taking your Naproxen (NSAID) be sure to take it with a full meal. Take this medication twice a day for three days, then as needed. Only use your pain medication for severe pain. Do not operate heavy machinery while on pain medication or muscle relaxer.  Flexeril (muscle relaxer) can be used as needed and you can take 1 or 2 pills up to three times a day.  Followup with your doctor if your symptoms persist greater than a week. If you do not have a doctor to followup with you may use the resource guide listed below to help you find one. In addition to the medications I have provided use heat and/or cold therapy as we discussed to treat your muscle aches. 15 minutes on and 15 minutes off.  Come back to the emerge department for any new or worsening concerning symptoms as we spoke about.

## 2019-10-25 NOTE — ED Provider Notes (Signed)
Ryan Harper Provider Note   CSN: [id number] Arrival date & time: 10/25/19  1622     History Chief Complaint  Patient presents with  . Neck Pain    Ryan Harper is a 34 y.o. male with pertinent medical history of A. fib with a flutter ablation in 2020 that presents to the emergency department for neck pain.  Patient states that neck pain started this morning after he thinks he slept wrong on his neck.  States that it is in both sides of his neck and goes into his shoulder blades.  States that he had to miss work for this.  Denies any chest pain, headache, vision changes, shortness of breath, nausea, vomiting abdominal pain, gait abnormality, confusion.  States that he was in normal health before this.  States that he took some ibuprofen which did not help.  States that the pain was severe this morning, has eased off and is now bearable.  Denies any relieving or worsening factors.  Denies any injury to neck, no falls.  No head pain.  States that he needs a work note for missing work today.  HPI     Past Medical History:  Diagnosis Date  . History of recurrent UTIs   . Pericarditis   . Screening for HIV (human immunodeficiency virus)     Patient Active Problem List   Diagnosis Date Noted  . Arrhythmia 04/26/2018  . Rapid atrial fibrillation (HCC) 04/25/2018    Past Surgical History:  Procedure Laterality Date  . A-FLUTTER ABLATION N/A 01/29/2019   Procedure: A-FLUTTER ABLATION;  Surgeon: Regan Lemming, MD;  Location: [facility];  Service: Cardiovascular;  Laterality: N/A;  . TYMPANOSTOMY TUBE PLACEMENT    . TYMPANOSTOMY TUBE PLACEMENT  1992       Family History  Family history unknown: Yes    Social History   Tobacco Use  . Smoking status: Never Smoker  . Smokeless tobacco: Never Used  Vaping Use  . Vaping Use: Never used  Substance Use Topics  . Alcohol use: Yes    Comment: social  . Drug use: No    Home  Medications Prior to Admission medications   Medication Sig Start Date End Date Taking? Authorizing Provider  acetaminophen (TYLENOL) 500 MG tablet Take 500 mg by mouth every 6 (six) hours as needed (pain).     [provider]  cyclobenzaprine (FLEXERIL) 10 MG tablet Take 1 tablet (10 mg total) by mouth 2 (two) times daily as needed for muscle spasms. 10/25/19   Farrel Gordon, PA-C  ibuprofen (ADVIL) 800 MG tablet Take 1 tablet (800 mg total) by mouth every 8 (eight) hours as needed. Patient not taking: Reported on 05/16/2019 10/25/18   Orland Mustard, MD  Multiple Vitamin (MULTIVITAMIN WITH MINERALS) TABS tablet Take 1 tablet by mouth daily.    [provider]    Allergies    Penicillins  Review of Systems   Review of Systems  Constitutional: Negative for diaphoresis, fatigue and fever.  Eyes: Negative for visual disturbance.  Respiratory: Negative for apnea, cough, choking, chest tightness, shortness of breath and wheezing.   Cardiovascular: Negative for chest pain.  Gastrointestinal: Negative for nausea and vomiting.  Musculoskeletal: Positive for neck pain. Negative for arthralgias, back pain, gait problem, myalgias and neck stiffness.  Skin: Negative for color change, pallor, rash and wound.  Neurological: Negative for syncope, weakness, light-headedness, numbness and headaches.  Psychiatric/Behavioral: Negative for behavioral problems and confusion.  Physical Exam Updated Vital Signs BP 120/64 (BP Location: Left Arm)   Pulse 68   Temp 97.8 F (36.6 C) (Oral)   Resp 18   Ht 6' (1.829 m)   Wt 102.1 kg   SpO2 99%   BMI 30.52 kg/m   Physical Exam Constitutional:      General: He is not in acute distress.    Appearance: Normal appearance. He is not ill-appearing, toxic-appearing or diaphoretic.  HENT:     Head: Normocephalic and atraumatic.  Neck:      Comments: No midline tenderness, paraspinal muscle tenderness to neck.  Full range of motion to neck  without any rigidity. Cardiovascular:     Rate and Rhythm: Normal rate and regular rhythm.     Pulses: Normal pulses.     Heart sounds: Normal heart sounds.  Pulmonary:     Effort: Pulmonary effort is normal.     Breath sounds: Normal breath sounds.  Musculoskeletal:        General: Normal range of motion.     Cervical back: Normal range of motion and neck supple. Tenderness present. No rigidity.  Lymphadenopathy:     Cervical: No cervical adenopathy.  Skin:    General: Skin is warm and dry.     Capillary Refill: Capillary refill takes less than 2 seconds.  Neurological:     General: No focal deficit present.     Mental Status: He is alert and oriented to person, place, and time.  Psychiatric:        Mood and Affect: Mood normal.        Behavior: Behavior normal.        Thought Content: Thought content normal.     ED Results / Procedures / Treatments   Labs (all labs ordered are listed, but only abnormal results are displayed) Labs Reviewed - No data to display  EKG None  Radiology No results found.  Procedures Procedures (including critical care time)  Medications Ordered in ED Medications - No data to display  ED Course  I have reviewed the triage vital signs and the nursing notes.  Pertinent labs & imaging results that were available during my care of the patient were reviewed by me and considered in my medical decision making (see chart for details).    MDM Rules/Calculators/A&P                         Ryan Harper is a 34 y.o. male with pertinent medical history of A. fib with a flutter ablation in 2020 that presents to the emergency department for neck pain.most likely muscle strain from sleeping on his neck.  Unlikely to be atypical ACS.  No midline tenderness to neck.  No meningismus.  Shared decision-making about EKG and further work-up for ACS since patient is having neck pain that radiates to her shoulders, could be atypical, although unlikely.   Patient states that he does not want to be worked up for this, states that he came here mostly for work note.  Patient to be discharged with Flexeril, follow-up with PCP.  Patient agreeable.  Doubt need for further emergent work up at this time. I explained the diagnosis and have given explicit precautions to return to the ER including for any other new or worsening symptoms. The patient understands and accepts the medical plan as it's been dictated and I have answered their questions. Discharge instructions concerning home care and prescriptions have been given. The patient  is STABLE and is discharged to home in good condition.    Final Clinical Impression(s) / ED Diagnoses Final diagnoses:  Strain of neck muscle, initial encounter    Rx / DC Orders ED Discharge Orders         Ordered    cyclobenzaprine (FLEXERIL) 10 MG tablet  2 times daily PRN        10/25/19 2114           Farrel Gordon, PA-C 10/25/19 2309    Charlynne Pander, MD 10/25/19 [phone]

## 2020-03-07 IMAGING — DX DG CHEST 2V
2 series · 2 of 2 positions shown · non-contrast
Comparison: None.

CLINICAL DATA: Shortness of breath with cough and fever

EXAM:
CHEST - 2 VIEW

[chest pa]
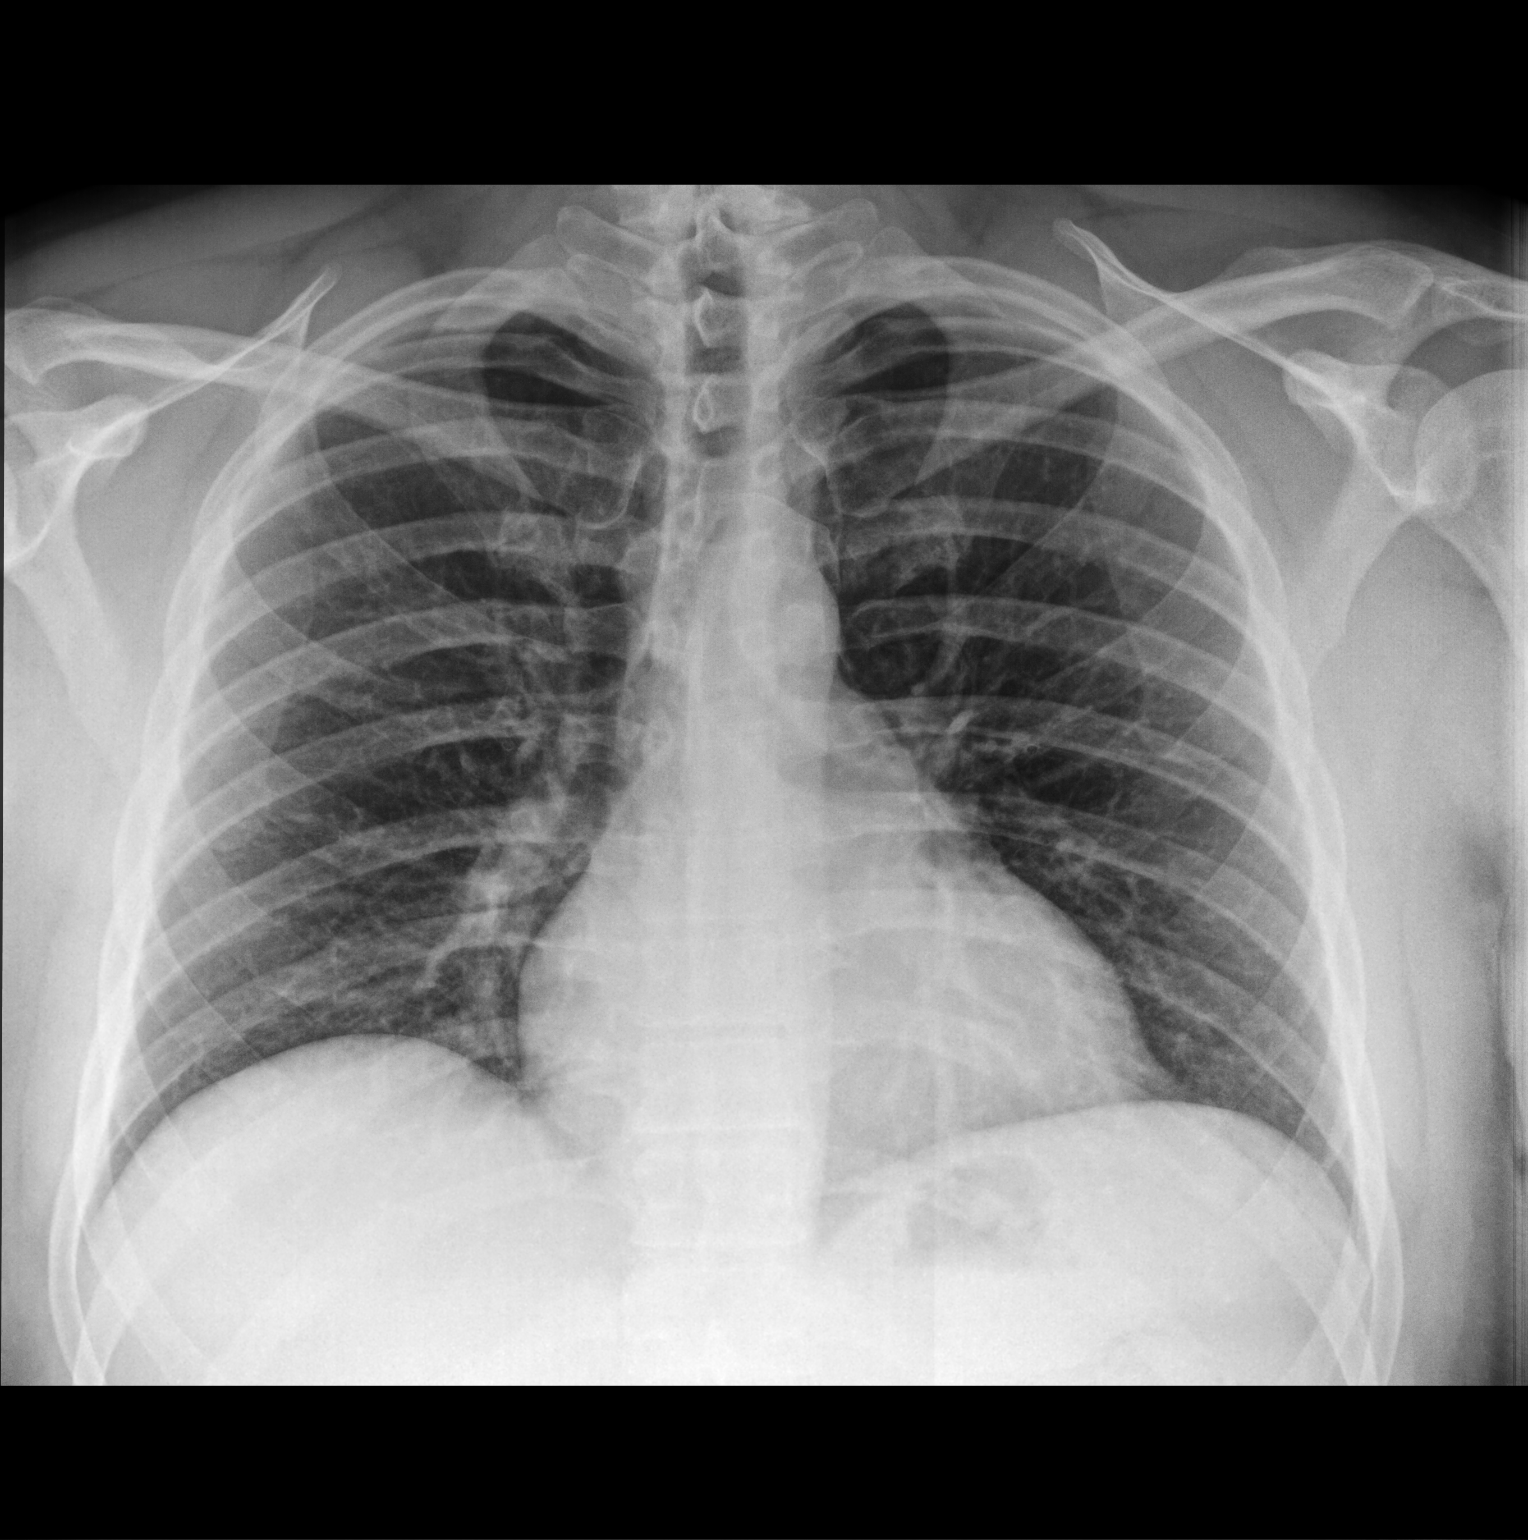

[chest lat]
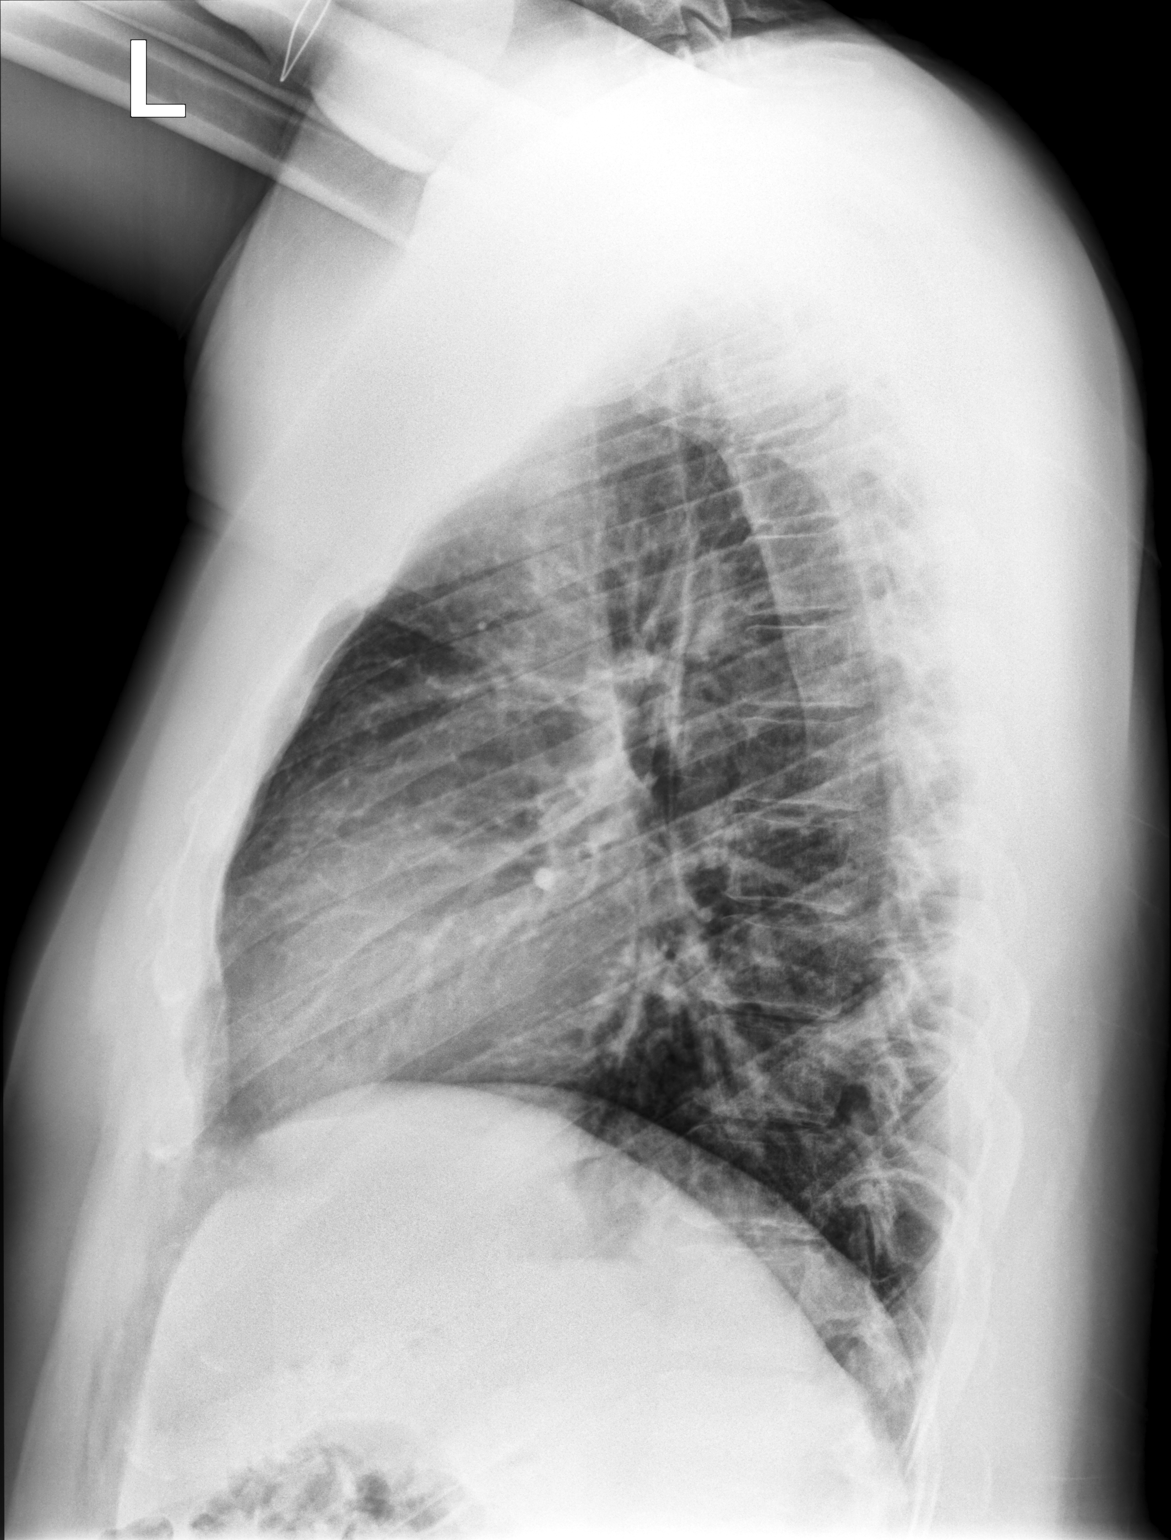

[2 of 2 positions shown; findings below may reference images not displayed]

FINDINGS: Lungs are clear. Heart size and pulmonary vascularity are normal. No
adenopathy. No bone lesions.
IMPRESSION: No edema or consolidation.

## 2021-01-15 ENCOUNTER — Ambulatory Visit (INDEPENDENT_AMBULATORY_CARE_PROVIDER_SITE_OTHER): Payer: BC Managed Care – PPO | Admitting: Physician Assistant

## 2021-01-15 ENCOUNTER — Ambulatory Visit (INDEPENDENT_AMBULATORY_CARE_PROVIDER_SITE_OTHER)
Admission: RE | Admit: 2021-01-15 | Discharge: 2021-01-15 | Disposition: A | Discharge status: Home / Self Care | Payer: BC Managed Care – PPO | Source: Ambulatory Visit | Attending: Physician Assistant | Admitting: Physician Assistant

## 2021-01-15 ENCOUNTER — Other Ambulatory Visit: Payer: Self-pay

## 2021-01-15 VITALS — BP 130/82 | HR 73 | Temp 97.8°F | Ht 72.0 in | Wt 283.2 lb

## 2021-01-15 DIAGNOSIS — M25572 Pain in left ankle and joints of left foot: Secondary | ICD-10-CM | POA: Diagnosis not present

## 2021-01-15 DIAGNOSIS — M25571 Pain in right ankle and joints of right foot: Secondary | ICD-10-CM

## 2021-01-15 DIAGNOSIS — U099 Post covid-19 condition, unspecified: Secondary | ICD-10-CM

## 2021-01-15 DIAGNOSIS — M7989 Other specified soft tissue disorders: Secondary | ICD-10-CM | POA: Diagnosis not present

## 2021-01-15 DIAGNOSIS — G8929 Other chronic pain: Secondary | ICD-10-CM

## 2021-01-15 DIAGNOSIS — R519 Headache, unspecified: Secondary | ICD-10-CM | POA: Diagnosis not present

## 2021-01-15 NOTE — Patient Instructions (Signed)
Good to meet you. Please go to [facility] for XRAY of R ankle, will call with results and treatment pending XRAY.  I will complete FMLA paperwork for your chronic headaches.  Please f/up sooner if they become more persistent or change in any way.

## 2021-01-15 NOTE — Progress Notes (Signed)
Subjective:    Patient ID: Ryan Harper, male    DOB: 08-16-1985, 35 y.o.   MRN: [id number]  Chief Complaint  Patient presents with   Ankle Pain    Right side     HPI Patient is in today for TOC from Dr. Artis Flock and to address a few concerns.   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 Severe headaches at least 2 days out of the week, controlled with Ibuprofen 800 mg BID. Missed work most recently on 12/25/20. Has seen neurology, Dr. Everlena Cooper, MRI, MRA brain negative. Told to take Ibuprofen no more than twice per week, avoid daily preventive meds unless more bothersome.   Right ankle pain -01/10/21 DOI -Stepped of a ledge, felt like it gave out on him -Limited ROM -Slowly improving since Sunday with him staying off it -Still having pain with standing prolonged -No prior injuries to this ankle   Past Medical History:  Diagnosis Date   History of recurrent UTIs    Pericarditis    Screening for HIV (human immunodeficiency virus)     Past Surgical History:  Procedure Laterality Date   A-FLUTTER ABLATION N/A 01/29/2019   Procedure: A-FLUTTER ABLATION;  Surgeon: Regan Lemming, MD;  Location: [facility];  Service: Cardiovascular;  Laterality: N/A;   TYMPANOSTOMY TUBE PLACEMENT     TYMPANOSTOMY TUBE PLACEMENT  1992    Family History  Family history unknown: Yes    Social History   Tobacco Use   Smoking status: Never   Smokeless tobacco: Never  Vaping Use   Vaping Use: Never used  Substance Use Topics   Alcohol use: Yes    Comment: social   Drug use: No     Allergies  Allergen Reactions   Penicillins Other (See Comments)    Did it involve swelling of the face/tongue/throat, SOB, or low BP? Unknown Did it involve sudden or severe rash/hives, skin peeling, or any reaction on the inside of your mouth or nose? Unknown Did you need to seek medical attention at a hospital or doctor's office?  Unknown When did it last happen?   always told not to take    If all above answers are "NO", may proceed with cephalosporin use.     Review of Systems NEGATIVE UNLESS OTHERWISE INDICATED IN HPI      Objective:     BP 130/82   Pulse 73   Temp 97.8 F (36.6 C)   Ht 6' (1.829 m)   Wt 283 lb 3.2 oz (128.5 kg)   SpO2 98%   BMI 38.41 kg/m   Wt Readings from Last 3 Encounters:  01/15/21 283 lb 3.2 oz (128.5 kg)  10/25/19 225 lb (102.1 kg)  05/17/19 271 lb (122.9 kg)    BP Readings from Last 3 Encounters:  01/15/21 130/82  10/25/19 120/64  03/07/19 139/82     Physical Exam Vitals and nursing note reviewed.  Constitutional:      General: He is not in acute distress.    Appearance: Normal appearance. He is not toxic-appearing.  HENT:     Head: Normocephalic and atraumatic.     Right Ear: External ear normal.     Left Ear: External ear normal.     Nose: Nose normal.     Mouth/Throat:     Mouth: Mucous membranes are moist.     Pharynx: Oropharynx is clear.  Eyes:     Extraocular Movements: Extraocular movements intact.  Conjunctiva/sclera: Conjunctivae normal.     Pupils: Pupils are equal, round, and reactive to light.  Cardiovascular:     Rate and Rhythm: Normal rate and regular rhythm.     Pulses: Normal pulses.     Heart sounds: Normal heart sounds.  Pulmonary:     Effort: Pulmonary effort is normal.     Breath sounds: Normal breath sounds.  Musculoskeletal:        General: Normal range of motion.     Cervical back: Normal range of motion and neck supple.     Right ankle: Swelling (mild edema diffuse lateral ankle) present. No deformity or lacerations. Tenderness present over the AITF ligament. Normal range of motion (some pain with ROM).     Right Achilles Tendon: No tenderness. Thompson's test negative.  Skin:    General: Skin is warm and dry.  Neurological:     General: No focal deficit present.     Mental Status: He is alert and oriented to person,  place, and time.     Cranial Nerves: No cranial nerve deficit.     Motor: No weakness.     Gait: Gait normal.  Psychiatric:        Mood and Affect: Mood normal.        Behavior: Behavior normal.       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 Acute right ankle pain -Most likely sprained -RICE method, ankle brace, stay off as much as possible -Will check XRAY to confirm no fracture as he stands on feet a lot for work  2. 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 Occasional wrong-word or sound-a-like substitutions may have occurred due to the inherent limitations of voice recognition software.  Time Spent: 30 minutes of total time was spent on the date of the encounter performing the following actions: chart review prior to seeing the patient, obtaining history, performing a medically necessary exam, counseling on the treatment plan, placing orders, and documenting in our EHR.    Chimere Klingensmith M Iria Jamerson, PA-C

## 2021-02-03 ENCOUNTER — Telehealth: Payer: Self-pay | Admitting: Physician Assistant

## 2021-02-03 NOTE — Telephone Encounter (Signed)
Left message for patient to call clinic,patient has a appt with Podiatry 12/30

## 2021-02-05 ENCOUNTER — Ambulatory Visit (INDEPENDENT_AMBULATORY_CARE_PROVIDER_SITE_OTHER): Payer: BC Managed Care – PPO | Admitting: Podiatry

## 2021-02-05 ENCOUNTER — Encounter: Payer: Self-pay | Admitting: Podiatry

## 2021-02-05 ENCOUNTER — Other Ambulatory Visit: Payer: Self-pay

## 2021-02-05 ENCOUNTER — Ambulatory Visit (INDEPENDENT_AMBULATORY_CARE_PROVIDER_SITE_OTHER): Payer: BC Managed Care – PPO

## 2021-02-05 ENCOUNTER — Other Ambulatory Visit: Payer: Self-pay | Admitting: Podiatry

## 2021-02-05 DIAGNOSIS — M7751 Other enthesopathy of right foot: Secondary | ICD-10-CM

## 2021-02-05 DIAGNOSIS — M779 Enthesopathy, unspecified: Secondary | ICD-10-CM

## 2021-02-05 MED ORDER — PREDNISONE 10 MG PO TABS: ORAL_TABLET | ORAL | 0 refills | Status: DC

## 2021-02-05 MED ORDER — TRIAMCINOLONE ACETONIDE 10 MG/ML IJ SUSP
10.0000 mg | Freq: Once | INTRAMUSCULAR | Status: AC
  Administered 2021-02-05: 10:00:00 10 mg

## 2021-02-05 NOTE — Progress Notes (Signed)
Subjective:   Patient ID: Ryan Harper, male   DOB: 35 y.o.   MRN: [id number]   HPI Patient presents stating he hurt his right ankle and its been sore for around a month.  Also states the inside of the ankle at times is sore with the pain worse in the outside and also into the Achilles tendon has been sore.  States has been limping and it is moderately obese which is complicating factor.  Patient does not smoke likes to be active   Review of Systems  All other systems reviewed and are negative.      Objective:  Physical Exam Vitals and nursing note reviewed.  Constitutional:      Appearance: He is well-developed.  Pulmonary:     Effort: Pulmonary effort is normal.  Musculoskeletal:        General: Normal range of motion.  Skin:    General: Skin is warm.  Neurological:     Mental Status: He is alert.    Neurovascular status intact muscle strength within normal limits range of motion is reduced right as he does have pain and is splinting for pain in his right ankle with the pain worse in the sinus tarsi mild discomfort also in the posterior tibial tendon with moderate flatfoot deformity.  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

## 2021-02-19 ENCOUNTER — Ambulatory Visit: Payer: BC Managed Care – PPO | Admitting: Podiatry

## 2021-04-02 DIAGNOSIS — Z20822 Contact with and (suspected) exposure to covid-19: Secondary | ICD-10-CM | POA: Diagnosis not present

## 2021-04-06 ENCOUNTER — Ambulatory Visit (INDEPENDENT_AMBULATORY_CARE_PROVIDER_SITE_OTHER): Payer: BC Managed Care – PPO | Admitting: Physician Assistant

## 2021-04-06 ENCOUNTER — Encounter: Payer: Self-pay | Admitting: Physician Assistant

## 2021-04-06 ENCOUNTER — Other Ambulatory Visit: Payer: Self-pay

## 2021-04-06 VITALS — BP 110/72 | HR 69 | Temp 97.6°F | Ht 72.0 in | Wt 289.2 lb

## 2021-04-06 DIAGNOSIS — R040 Epistaxis: Secondary | ICD-10-CM

## 2021-04-06 DIAGNOSIS — G8929 Other chronic pain: Secondary | ICD-10-CM

## 2021-04-06 DIAGNOSIS — R051 Acute cough: Secondary | ICD-10-CM

## 2021-04-06 DIAGNOSIS — R519 Headache, unspecified: Secondary | ICD-10-CM | POA: Diagnosis not present

## 2021-04-06 DIAGNOSIS — U099 Post covid-19 condition, unspecified: Secondary | ICD-10-CM | POA: Diagnosis not present

## 2021-04-06 MED ORDER — BENZONATATE 100 MG PO CAPS: 100.0000 mg | ORAL_CAPSULE | Freq: Three times a day (TID) | ORAL | 0 refills | Status: AC | PRN

## 2021-04-06 MED ORDER — MUPIROCIN CALCIUM 2 % NA OINT: 1.0000 "application " | TOPICAL_OINTMENT | Freq: Two times a day (BID) | NASAL | 0 refills | Status: DC

## 2021-04-06 MED ORDER — PROPRANOLOL HCL 40 MG PO TABS: ORAL_TABLET | ORAL | 2 refills | Status: DC

## 2021-04-06 NOTE — Progress Notes (Signed)
Subjective:    Patient ID: Ryan Harper, male    DOB: 04/09/1985, 36 y.o.   MRN: [id number]  Chief Complaint  Patient presents with   Headache    Pt has been having headaches off and on x 2 years since he had COVID in 2021. Left side of head, has dizziness and light sensitivity. He takes Ibuprofen 800 mg with relief.   Nosebleeds    Pt has been having nosebleeds everyday since last week, right nostril. Bleeding last for 5-10 minutes bright red blood.    Patient is in today for headaches, recent sickness with nosebleeds.  Mom admitted for COVID-19 and pneumonia last week. Everyone else became sick in the house as well. He was tested on Thursday 2/23 at [facility] - flu and COVID came back negative.   Started having nosebleeds in the last week. No bleeding so far today and none yesterday. Only out of the R nostril.  Very intermittent.  Resolved in less than 5 minutes spontaneously. Wife had nosebleeds with her sickness as well. They do run a humidifier in their room.  He states that he has been really congested.  Still having headaches at least 2-3 times per week. Worse last week with illness - felt the same when he had COVID-19 the first time. "Foggy" feeling.  He takes ibuprofen 800 mg as needed and this helps the headaches.  He has not taken anything daily to try to prevent the headaches.  He says that he is not sure if he wants to do this yet or not.  Past Medical History:  Diagnosis Date   History of recurrent UTIs    Pericarditis    Screening for HIV (human immunodeficiency virus)     Past Surgical History:  Procedure Laterality Date   A-FLUTTER ABLATION N/A 01/29/2019   Procedure: A-FLUTTER ABLATION;  Surgeon: Regan Lemming, MD;  Location: [facility];  Service: Cardiovascular;  Laterality: N/A;   TYMPANOSTOMY TUBE PLACEMENT     TYMPANOSTOMY TUBE PLACEMENT  1992    Family History  Family history unknown: Yes    Social History   Tobacco Use   Smoking status:  Never   Smokeless tobacco: Never  Vaping Use   Vaping Use: Never used  Substance Use Topics   Alcohol use: Yes    Comment: social   Drug use: No     Allergies  Allergen Reactions   Penicillins Other (See Comments)    Did it involve swelling of the face/tongue/throat, SOB, or low BP? Unknown Did it involve sudden or severe rash/hives, skin peeling, or any reaction on the inside of your mouth or nose? Unknown Did you need to seek medical attention at a hospital or doctor's office? Unknown When did it last happen?   always told not to take    If all above answers are NO, may proceed with cephalosporin use.     Review of Systems  Neurological:  Positive for headaches.  NEGATIVE UNLESS OTHERWISE INDICATED IN HPI      Objective:     BP 110/72 (BP Location: Left Arm, Patient Position: Sitting, Cuff Size: Large)    Pulse 69    Temp 97.6 F (36.4 C) (Temporal)    Ht 6' (1.829 m)    Wt 289 lb 4 oz (131.2 kg)    SpO2 96%    BMI 39.23 kg/m   Wt Readings from Last 3 Encounters:  04/06/21 289 lb 4 oz (131.2 kg)  01/15/21 283 lb  3.2 oz (128.5 kg)  10/25/19 225 lb (102.1 kg)    BP Readings from Last 3 Encounters:  04/06/21 110/72  01/15/21 130/82  10/25/19 120/64     Physical Exam Vitals and nursing note reviewed.  Constitutional:      General: He is not in acute distress.    Appearance: Normal appearance. He is obese. He is not toxic-appearing.  HENT:     Head: Normocephalic and atraumatic.     Right Ear: Tympanic membrane, ear canal and external ear normal.     Left Ear: Tympanic membrane, ear canal and external ear normal.     Nose: Mucosal edema present. No nasal deformity or nasal tenderness.     Right Turbinates: Swollen.     Left Turbinates: Swollen.     Right Sinus: No maxillary sinus tenderness.     Left Sinus: No maxillary sinus tenderness.     Comments: No bleeding in either nare    Mouth/Throat:     Mouth: Mucous membranes are moist.     Pharynx:  Oropharynx is clear.  Eyes:     Extraocular Movements: Extraocular movements intact.     Conjunctiva/sclera: Conjunctivae normal.     Pupils: Pupils are equal, round, and reactive to light.  Cardiovascular:     Rate and Rhythm: Normal rate and regular rhythm.     Pulses: Normal pulses.     Heart sounds: Normal heart sounds.  Pulmonary:     Effort: Pulmonary effort is normal.     Breath sounds: Normal breath sounds.  Abdominal:     General: Abdomen is flat.  Musculoskeletal:        General: Normal range of motion.     Cervical back: Normal range of motion and neck supple.  Skin:    General: Skin is warm and dry.  Neurological:     General: No focal deficit present.     Mental Status: He is alert and oriented to person, place, and time.     Cranial Nerves: No cranial nerve deficit.     Motor: No weakness.     Coordination: Romberg sign negative. Coordination normal.     Gait: Gait normal.  Psychiatric:        Mood and Affect: Mood normal.        Behavior: Behavior normal.       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    Dispense:  30 tablet    Refill:  2   benzonatate (TESSALON PERLES) 100 MG capsule    Sig: Take 1 capsule (100 mg total) by mouth 3 (three) times daily as needed for up to 10 days for cough.    Dispense:  30 capsule    Refill:  0   mupirocin nasal ointment (BACTROBAN) 2 %    Sig: Place 1 application into the nose 2 (two) times daily. Use one-half of tube in each nostril twice daily for five (5) days. After application, press sides of nose together and gently massage.  Dispense:  10 g    Refill:  0    1. 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  Possible side effects discussed with him.  He is still not sure if he wants to start on a daily preventive medication.  I sent the prescription for him to the pharmacy in case he changes his mind.  He will let me know how he is doing.  2. Epistaxis -I think this was secondary to viral illness last week.  Seems to be doing better the last few days.  There were no red flags.  Advised mupirocin ointment, nasal saline, humidifier.  3. Acute cough -Secondary to viral illness last week.  Advise tea with honey and lemon.  Tessalon Perles for added relief.  Patient to follow-up as needed.   This note was prepared with assistance of Conservation officer, historic buildings. Occasional wrong-word or sound-a-like substitutions may have occurred due to the inherent limitations of voice recognition software.  Time Spent: 39 minutes of total time was spent on the date of the encounter performing the following actions: chart review prior to seeing the patient, obtaining history, performing a medically necessary exam, counseling on the treatment plan, placing orders, and documenting in our EHR.    Maurilio Puryear M Argel Pablo, PA-C

## 2021-04-06 NOTE — Patient Instructions (Addendum)
Good to see you today!  Continue to push fluids and try tea with honey or lemon for your cough. I have sent tessalon perles to take as needed as well.  Use the mupirocin ointment for nasal prevention since you have just had illness and nosebleed. Try nasal saline to keep nasal passages moist.  I have also sent a medication to your pharmacy (inderal) to help with migraine prevention. Let me know if you start on this and keep me posted how you are doing. Monitor BP if you take this medication.

## 2021-04-07 IMAGING — MR MR HEAD W/O CM
9 series · 48 of 48 positions shown · non-contrast
Comparison: None.

CLINICAL DATA: Chronic headaches for 8 months

EXAM:
MRI HEAD WITHOUT CONTRAST
MRA HEAD WITHOUT CONTRAST
TECHNIQUE: Multiplanar, multiecho pulse sequences of the brain and surrounding
structures were obtained without intravenous contrast. Angiographic
images of the head were obtained using MRA technique without
contrast.

[Series 2: DWI · axial · 3.0mm · 1.80mm/px · z∈[-50,+94]mm · 10 of 100 slices shown (1 of 4)]
[im 1/100]
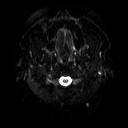
[im 12/100]
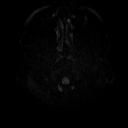
[im 23/100]
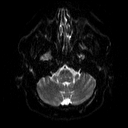
[im 34/100]
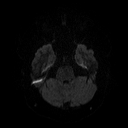
[im 45/100]
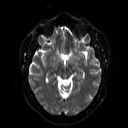
[im 56/100]
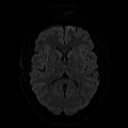
[im 67/100]
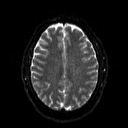
[im 78/100]
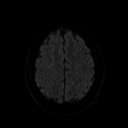
[im 89/100]
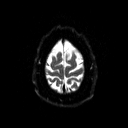
[im 100/100]
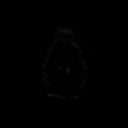

[Series 3: DWI · axial · 3.0mm · 1.80mm/px · z∈[-50,+94]mm · 5 of 50 slices shown (2 of 4)]
[im 1/50]
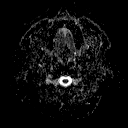
[im 13/50]
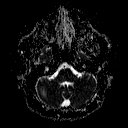
[im 25/50]
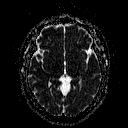
[im 37/50]
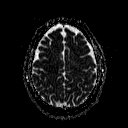
[im 50/50]
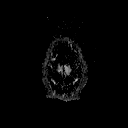

[Series 4: DWI · coronal · 5.0mm · 1.80mm/px · 6 of 66 slices shown (3 of 4)]
[im 1/66]
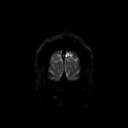
[im 14/66]
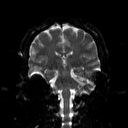
[im 27/66]
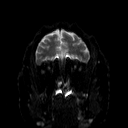
[im 40/66]
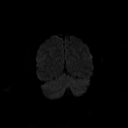
[im 53/66]
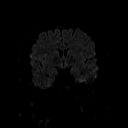
[im 66/66]
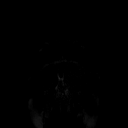

[Series 5: DWI · coronal · 5.0mm · 1.80mm/px · 3 of 34 slices shown (4 of 4)]
[im 1/34]
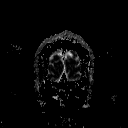
[im 17/34]
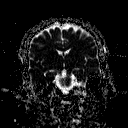
[im 34/34]
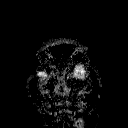

[Series 6: T2 · axial · 5.0mm · 0.51mm/px · z∈[-48,+96]mm · 2 of 22 slices shown (1 of 2)]
[im 1/22]
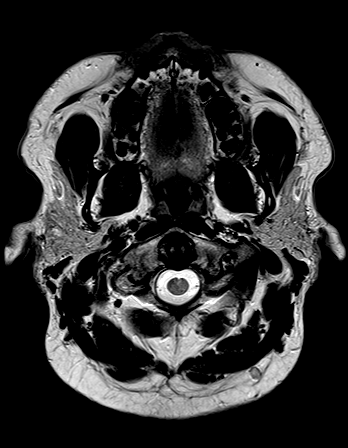
[im 22/22]
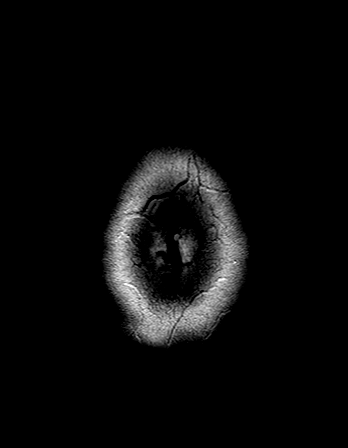

[Series 7: FLAIR · axial · 3.0mm · 0.45mm/px · z∈[-49,+93]mm · 3 of 32 slices shown]
[im 1/32]
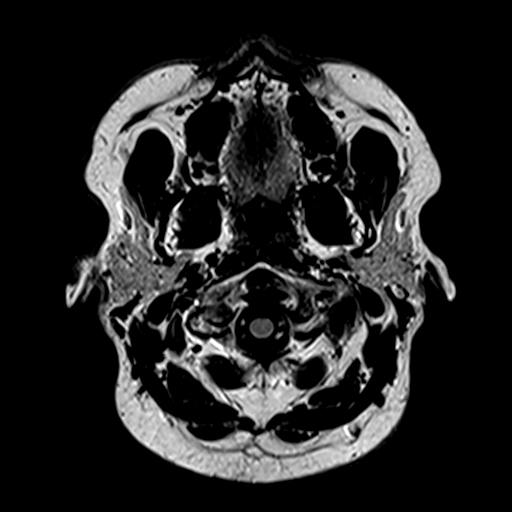
[im 16/32]
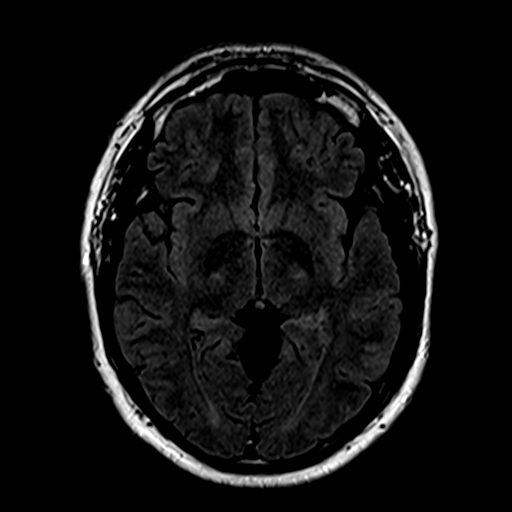
[im 32/32]
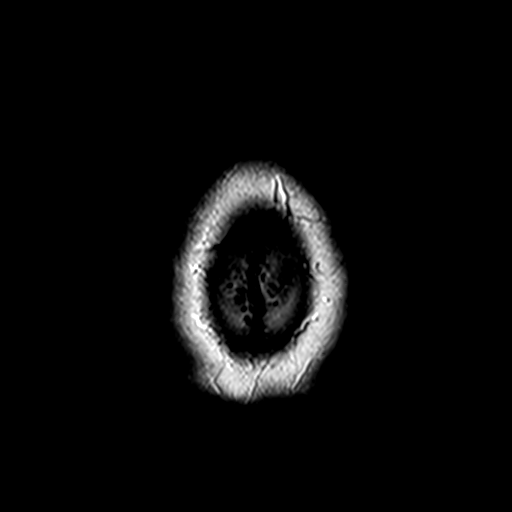

[Series 9: swi_images · axial · 4.0mm · 0.90mm/px · z∈[-46,+91]mm · 3 of 36 slices shown]
[im 1/36]
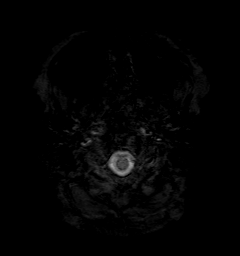
[im 18/36]
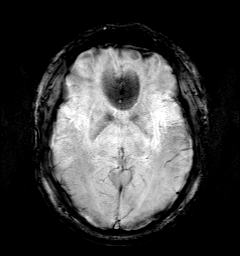
[im 36/36]
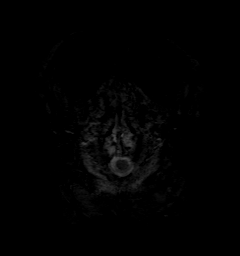

[Series 10: t1_mpr_tra · axial · 1.0mm · 0.71mm/px · z∈[-46,+94]mm · 14 of 144 slices shown]
[im 1/144]
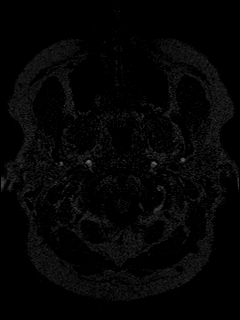
[im 12/144]
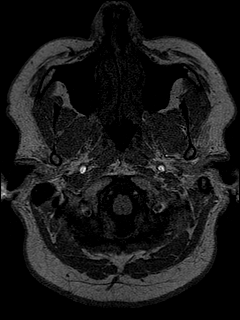
[im 23/144]
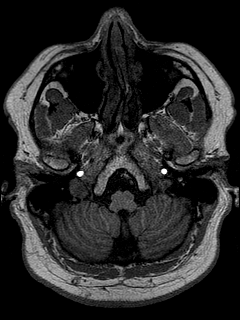
[im 34/144]
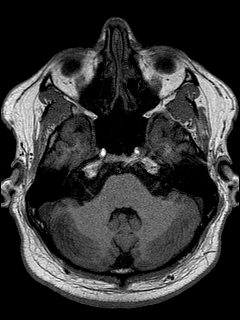
[im 45/144]
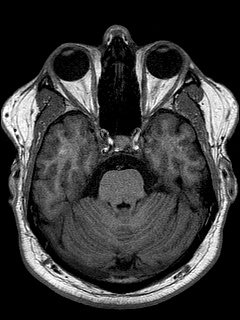
[im 56/144]
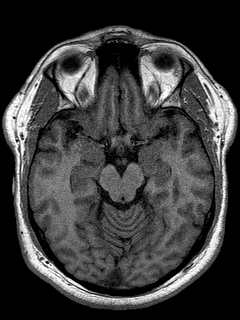
[im 67/144]
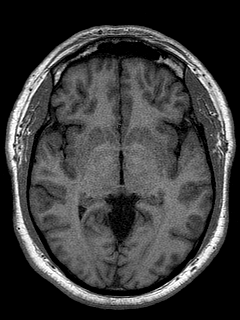
[im 78/144]
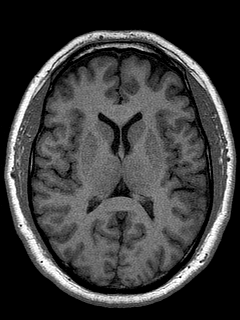
[im 89/144]
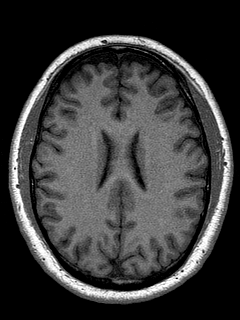
[im 100/144]
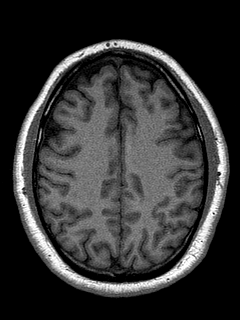
[im 111/144]
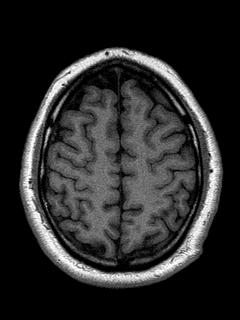
[im 122/144]
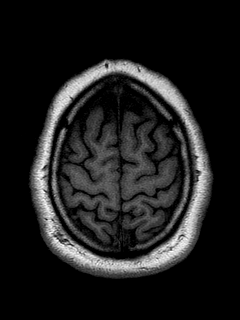
[im 133/144]
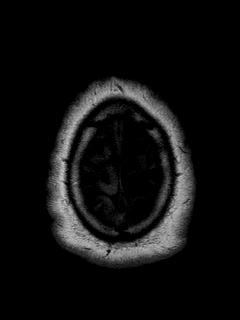
[im 144/144]
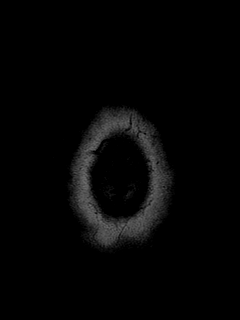

[Series 11: T2 · coronal · 5.0mm · 0.45mm/px · 2 of 25 slices shown (2 of 2)]
[im 1/25]
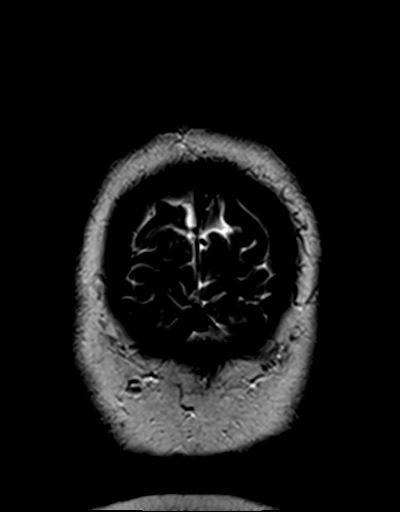
[im 25/25]
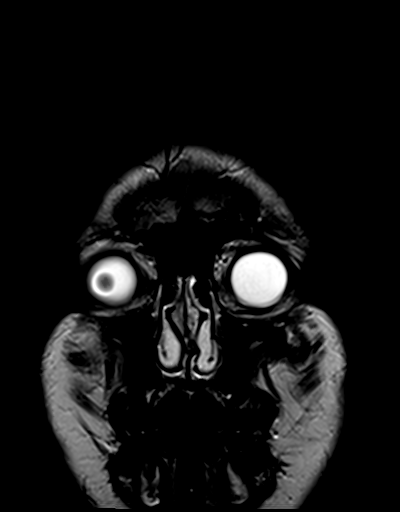

[48 of 48 positions shown; findings below may reference images not displayed]

FINDINGS: MRI HEAD FINDINGS

Brain: No acute infarct, acute hemorrhage or extra-axial collection.
Normal white matter signal. Normal volume of CSF spaces. No chronic
microhemorrhage. Normal midline structures.

Vascular: Normal flow voids.

Skull and upper cervical spine: Normal marrow signal.

Sinuses/Orbits: Negative.

Other: None.

MRA HEAD FINDINGS

POSTERIOR CIRCULATION:

--Vertebral arteries: Normal V4 segments.

--Posterior inferior cerebellar arteries (PICA): Patent origins from
the vertebral arteries.

--Anterior inferior cerebellar arteries (AICA): Patent origins from
the basilar artery.

--Basilar artery: Normal.

--Superior cerebellar arteries: Normal.

--Posterior cerebral arteries: Normal. Both originate from the
basilar artery. Posterior communicating arteries (p-comm) are
diminutive or absent.

ANTERIOR CIRCULATION:

--Intracranial internal carotid arteries: Normal.

--Anterior cerebral arteries (ACA): Normal. Both A1 segments are
present. Patent anterior communicating artery (a-comm).

--Middle cerebral arteries (MCA): Normal.
IMPRESSION: 1. Normal MRI of the brain.
2. Normal MRA of the head.

## 2021-11-19 ENCOUNTER — Ambulatory Visit (INDEPENDENT_AMBULATORY_CARE_PROVIDER_SITE_OTHER)
Admission: RE | Admit: 2021-11-19 | Discharge: 2021-11-19 | Disposition: A | Discharge status: Home / Self Care | Payer: BC Managed Care – PPO | Source: Ambulatory Visit | Attending: Physician Assistant | Admitting: Physician Assistant

## 2021-11-19 ENCOUNTER — Encounter: Payer: Self-pay | Admitting: Physician Assistant

## 2021-11-19 ENCOUNTER — Telehealth: Payer: Self-pay

## 2021-11-19 ENCOUNTER — Ambulatory Visit (INDEPENDENT_AMBULATORY_CARE_PROVIDER_SITE_OTHER): Payer: BC Managed Care – PPO | Admitting: Physician Assistant

## 2021-11-19 VITALS — BP 130/82 | HR 70 | Temp 97.8°F | Ht 72.0 in | Wt 281.8 lb

## 2021-11-19 DIAGNOSIS — M5442 Lumbago with sciatica, left side: Secondary | ICD-10-CM

## 2021-11-19 DIAGNOSIS — W19XXXA Unspecified fall, initial encounter: Secondary | ICD-10-CM

## 2021-11-19 DIAGNOSIS — M79605 Pain in left leg: Secondary | ICD-10-CM | POA: Diagnosis not present

## 2021-11-19 DIAGNOSIS — M545 Low back pain, unspecified: Secondary | ICD-10-CM | POA: Diagnosis not present

## 2021-11-19 MED ORDER — METHYLPREDNISOLONE 4 MG PO TBPK: ORAL_TABLET | ORAL | 0 refills | Status: AC

## 2021-11-19 MED ORDER — TIZANIDINE HCL 4 MG PO CAPS: 4.0000 mg | ORAL_CAPSULE | Freq: Three times a day (TID) | ORAL | 0 refills | Status: AC

## 2021-11-19 NOTE — Progress Notes (Signed)
Subjective:    Patient ID: Ryan Harper, male    DOB: Oct 02, 1985, 36 y.o.   MRN: [id number]  Chief Complaint  Patient presents with   Fall    Pt had fall on steps and now sensitive and sore lower back; says it feels like back popped afterwards while at work doing some lifting; pt very sore, shooting pain that's sharp going down leg as well    Fall   Patient is in today for low back pain.  DOI: 11/19/11, just prior to work - lost footing going up steps, fell back about 3 or 4, landed in a seated position on wood flooring. 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 Feels like leg wants to give out with standing sometimes. No prior back issues.   No tx's tried so far except ice.   Past Medical History:  Diagnosis Date   History of recurrent UTIs    Pericarditis    Screening for HIV (human immunodeficiency virus)     Past Surgical History:  Procedure Laterality Date   A-FLUTTER ABLATION N/A 01/29/2019   Procedure: A-FLUTTER ABLATION;  Surgeon: Constance Haw, MD;  Location: [place] [facility];  Service: Cardiovascular;  Laterality: N/A;   TYMPANOSTOMY TUBE PLACEMENT     TYMPANOSTOMY TUBE PLACEMENT  1992    Family History  Family history unknown: Yes    Social History   Tobacco Use   Smoking status: Never   Smokeless tobacco: Never  Vaping Use   Vaping Use: Never used  Substance Use Topics   Alcohol use: Yes    Comment: social   Drug use: No     Allergies  Allergen Reactions   Penicillins Other (See Comments)    Did it involve swelling of the face/tongue/throat, SOB, or low BP? Unknown Did it involve sudden or severe rash/hives, skin peeling, or any reaction on the inside of your mouth or nose? Unknown Did you need to seek medical attention at a hospital or doctor's office? Unknown When did it last happen?   always told not to take    If all above answers are "NO", may proceed with  cephalosporin use.     Review of Systems NEGATIVE UNLESS OTHERWISE INDICATED IN HPI      Objective:     BP 130/82 (BP Location: Left Arm)   Pulse 70   Temp 97.8 F (36.6 C) (Temporal)   Ht 6' (1.829 m)   Wt 281 lb 12.8 oz (127.8 kg)   SpO2 98%   BMI 38.22 kg/m   Wt Readings from Last 3 Encounters:  11/19/21 281 lb 12.8 oz (127.8 kg)  04/06/21 289 lb 4 oz (131.2 kg)  01/15/21 283 lb 3.2 oz (128.5 kg)    BP Readings from Last 3 Encounters:  11/19/21 130/82  04/06/21 110/72  01/15/21 130/82     Physical Exam Constitutional:      Appearance: Normal appearance. He is obese.  Cardiovascular:     Rate and Rhythm: Normal rate and regular rhythm.     Pulses: Normal pulses.  Pulmonary:     Effort: Pulmonary effort is normal.     Breath sounds: Normal breath sounds.  Musculoskeletal:        General: Tenderness (mid-low back, left low back TTP) present.     Right lower leg: No edema.     Left lower leg: No edema.     Comments: Normal  strength BLE, N/V intact  Skin:    Findings: No rash.  Neurological:     Mental Status: He is alert.        Assessment & Plan:  Acute left-sided low back pain with left-sided sciatica -     DG Lumbar Spine Complete; Future  Fall, initial encounter -     DG Lumbar Spine Complete; Future  Other orders -     methylPREDNISolone; Please take per packaging instructions.  Dispense: 21 tablet; Refill: 0 -     tiZANidine HCl; Take 1 capsule (4 mg total) by mouth 3 (three) times daily for 5 days.  Dispense: 15 capsule; Refill: 0   -Acute low back pain with L sciatica s/p fall on 11/18/21 -XRAY stat at [facility] to r/o acute findings -Medrol dose pak and tizanidine as directed -Ice / rest -Work note pending XRAY results -ED precautions advised     Tishara Pizano M Kaysee Hergert, PA-C

## 2021-11-22 ENCOUNTER — Telehealth: Payer: Self-pay

## 2021-11-22 NOTE — Telephone Encounter (Signed)
Ann Maki: [id number]  Status: sent   Sent to [facility] today  Drug tiZANidine HCl 4MG  capsules

## 2021-11-24 NOTE — Telephone Encounter (Signed)
[place] Auth denied and pt has been notified. Sent denial letter to scan center for our records

## 2021-12-02 ENCOUNTER — Encounter: Payer: Self-pay | Admitting: Physician Assistant

## 2021-12-02 ENCOUNTER — Ambulatory Visit (INDEPENDENT_AMBULATORY_CARE_PROVIDER_SITE_OTHER): Payer: BC Managed Care – PPO | Admitting: Physician Assistant

## 2021-12-02 VITALS — BP 118/79 | HR 76 | Temp 97.5°F | Ht 72.0 in | Wt 287.0 lb

## 2021-12-02 DIAGNOSIS — W19XXXD Unspecified fall, subsequent encounter: Secondary | ICD-10-CM

## 2021-12-02 DIAGNOSIS — M5442 Lumbago with sciatica, left side: Secondary | ICD-10-CM | POA: Diagnosis not present

## 2021-12-02 MED ORDER — MELOXICAM 15 MG PO TABS: 15.0000 mg | ORAL_TABLET | Freq: Every day | ORAL | 0 refills | Status: AC

## 2021-12-02 NOTE — Patient Instructions (Signed)
Meloxicam 15 mg daily, no other NSAIDs with this medication. Take with food. PT from our office will call to schedule. Work-note provided.  Recheck sooner if worse / any changes.

## 2021-12-02 NOTE — Progress Notes (Signed)
Subjective:    Patient ID: Ryan Harper, male    DOB: [birth year], 36 y.o.   MRN: [id number]  Chief Complaint  Patient presents with   Follow-up    Pt here for back pain. Pt states the nerve on the left side of back, made its way down to his foot. 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     HPI Patient is in today for recheck back pain. "A little better the last few days." Hard to get comfortable with sleeping. 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 Still feels restricted with movement, but improving.   Tx's currently: Stretches, icing, heating pad.  He completed course of Medrol Dosepak and muscle relaxant, which did seem to help.  "DOI: 11/19/11, just prior to work - lost footing going up steps, fell back about 3 or 4, landed in a seated position on wood flooring. 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 Feels like leg wants to give out with standing sometimes. No prior back issues."    Past Medical History:  Diagnosis Date   History of recurrent UTIs    Pericarditis    Screening for HIV (human immunodeficiency virus)     Past Surgical History:  Procedure Laterality Date   A-FLUTTER ABLATION N/A 01/29/2019   Procedure: A-FLUTTER ABLATION;  Surgeon: Constance Haw, MD;  Location: [place] [facility];  Service: Cardiovascular;  Laterality: N/A;   TYMPANOSTOMY TUBE PLACEMENT     TYMPANOSTOMY TUBE PLACEMENT  1992    Family History  Family history unknown: Yes    Social History   Tobacco Use   Smoking status: Never   Smokeless tobacco: Never  Vaping Use   Vaping Use: Never used  Substance Use Topics   Alcohol use: Yes    Comment: social   Drug use: No     Allergies  Allergen Reactions   Penicillins Other (See Comments)    Did it involve swelling of the face/tongue/throat, SOB, or low BP? Unknown Did it involve  sudden or severe rash/hives, skin peeling, or any reaction on the inside of your mouth or nose? Unknown Did you need to seek medical attention at a hospital or doctor's office? Unknown When did it last happen?   always told not to take    If all above answers are "NO", may proceed with cephalosporin use.     Review of Systems NEGATIVE UNLESS OTHERWISE INDICATED IN HPI      Objective:     BP 118/79 (BP Location: Left Arm, Patient Position: Sitting)   Pulse 76   Temp (!) 97.5 F (36.4 C) (Temporal)   Ht 6' (1.829 m)   Wt 287 lb (130.2 kg)   SpO2 95%   BMI 38.92 kg/m   Wt Readings from Last 3 Encounters:  12/02/21 287 lb (130.2 kg)  11/19/21 281 lb 12.8 oz (127.8 kg)  04/06/21 289 lb 4 oz (131.2 kg)    BP Readings from Last 3 Encounters:  12/02/21 118/79  11/19/21 130/82  04/06/21 110/72     Physical Exam Constitutional:      Appearance: Normal appearance. He is obese.  Cardiovascular:     Rate and Rhythm: Normal rate and regular rhythm.     Pulses: Normal pulses.  Pulmonary:     Effort: Pulmonary  effort is normal.     Breath sounds: Normal breath sounds.  Musculoskeletal:        General: Tenderness (mid-low back, left low back TTP -much better) present.     Right lower leg: No edema.     Left lower leg: No edema.     Comments: Normal strength BLE, N/V intact  Skin:    Findings: No rash.  Neurological:     Mental Status: He is alert.        Assessment & Plan:  Acute left-sided low back pain with left-sided sciatica -     Ambulatory referral to Physical Therapy  Fall, subsequent encounter -     Ambulatory referral to Physical Therapy  Other orders -     Meloxicam; Take 1 tablet (15 mg total) by mouth daily. Take with food.  Dispense: 30 tablet; Refill: 0   Plan to keep patient on light duty no more than 30 to 40 pounds of lifting while at work.  Work note provided.  He will benefit from physical therapy.  As he continues to gradually improve, I do not  feel like a MRI is necessary at this time, but low threshold to plan for imaging if worse or any changes.  No red flags on exam.  He will let me know if anything changes.  I think continuing a anti-inflammatory such as meloxicam 15 mg daily for the next 30 days will also benefit him.  He is agreeable and understanding of plan.     Return in about 4 weeks (around 12/30/2021) for recheck .  This note was prepared with assistance of Conservation officer, historic buildings. Occasional wrong-word or sound-a-like substitutions may have occurred due to the inherent limitations of voice recognition software.    Christain Niznik M Aashir Umholtz, PA-C

## 2022-05-23 ENCOUNTER — Encounter: Payer: Self-pay | Admitting: *Deleted

## 2024-01-30 DIAGNOSIS — R29810 Facial weakness: Secondary | ICD-10-CM | POA: Diagnosis not present

## 2024-01-30 DIAGNOSIS — I639 Cerebral infarction, unspecified: Secondary | ICD-10-CM | POA: Diagnosis not present

## 2024-01-30 DIAGNOSIS — G527 Disorders of multiple cranial nerves: Secondary | ICD-10-CM | POA: Diagnosis not present

## 2024-01-31 DIAGNOSIS — G510 Bell's palsy: Secondary | ICD-10-CM | POA: Diagnosis not present

## 2024-01-31 DIAGNOSIS — G527 Disorders of multiple cranial nerves: Secondary | ICD-10-CM | POA: Diagnosis not present

## 2024-02-05 ENCOUNTER — Ambulatory Visit: Payer: Self-pay | Admitting: Physician Assistant

## 2024-02-22 ENCOUNTER — Inpatient Hospital Stay: Payer: Self-pay | Admitting: Physician Assistant

## 2024-03-21 ENCOUNTER — Inpatient Hospital Stay: Payer: Self-pay | Admitting: Physician Assistant
